# Patient Record
Sex: Female | Born: 1975 | Race: White | Hispanic: No | Marital: Married | Smoking: Former smoker
Health system: Southern US, Community
[De-identification: ages and names within clinical notes are randomized; demographics above are authoritative.]

## PROBLEM LIST (undated history)

## (undated) DIAGNOSIS — B019 Varicella without complication: Secondary | ICD-10-CM

## (undated) DIAGNOSIS — N63 Unspecified lump in unspecified breast: Secondary | ICD-10-CM

## (undated) DIAGNOSIS — N921 Excessive and frequent menstruation with irregular cycle: Secondary | ICD-10-CM

## (undated) DIAGNOSIS — A6000 Herpesviral infection of urogenital system, unspecified: Secondary | ICD-10-CM

## (undated) HISTORY — PX: BREAST EXCISIONAL BIOPSY: SUR124

## (undated) HISTORY — DX: Varicella without complication: B01.9

## (undated) HISTORY — DX: Unspecified lump in unspecified breast: N63.0

## (undated) HISTORY — DX: Herpesviral infection of urogenital system, unspecified: A60.00

## (undated) HISTORY — DX: Excessive and frequent menstruation with irregular cycle: N92.1

---
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
## 1998-06-27 HISTORY — PX: WISDOM TOOTH EXTRACTION: SHX21

## 1998-06-27 HISTORY — PX: MOUTH SURGERY: SHX715

## 1999-02-04 ENCOUNTER — Encounter: Payer: Self-pay | Admitting: Emergency Medicine

## 1999-02-04 ENCOUNTER — Emergency Department (HOSPITAL_COMMUNITY): Admission: EM | Admit: 1999-02-04 | Discharge: 1999-02-04 | Payer: Self-pay | Admitting: Emergency Medicine

## 1999-02-14 ENCOUNTER — Emergency Department (HOSPITAL_COMMUNITY): Admission: EM | Admit: 1999-02-14 | Discharge: 1999-02-14 | Payer: Self-pay | Admitting: Emergency Medicine

## 1999-10-12 ENCOUNTER — Other Ambulatory Visit: Admission: RE | Admit: 1999-10-12 | Discharge: 1999-10-12 | Payer: Self-pay | Admitting: Obstetrics and Gynecology

## 2000-11-22 ENCOUNTER — Other Ambulatory Visit: Admission: RE | Admit: 2000-11-22 | Discharge: 2000-11-22 | Payer: Self-pay | Admitting: Obstetrics and Gynecology

## 2006-06-27 HISTORY — PX: EYE SURGERY: SHX253

## 2012-06-27 DIAGNOSIS — N63 Unspecified lump in unspecified breast: Secondary | ICD-10-CM

## 2012-06-27 HISTORY — DX: Unspecified lump in unspecified breast: N63.0

## 2012-09-20 ENCOUNTER — Ambulatory Visit: Payer: Self-pay

## 2012-10-02 ENCOUNTER — Encounter: Payer: Self-pay | Admitting: General Surgery

## 2012-10-02 ENCOUNTER — Other Ambulatory Visit: Payer: Self-pay

## 2012-10-02 ENCOUNTER — Ambulatory Visit (INDEPENDENT_AMBULATORY_CARE_PROVIDER_SITE_OTHER): Payer: Self-pay | Admitting: General Surgery

## 2012-10-02 VITALS — BP 128/86 | HR 78 | Resp 12 | Ht 68.0 in | Wt 181.0 lb

## 2012-10-02 DIAGNOSIS — N63 Unspecified lump in unspecified breast: Secondary | ICD-10-CM

## 2012-10-02 NOTE — Progress Notes (Signed)
Patient ID: Emily Jensen, female   DOB: 02/27/1976, 37 y.o.   MRN: [id number]  Chief Complaint  Patient presents with  . Breast Mass    left breast mass 12 o'clock    HPI Emily Jensen is a 37 y.o. female here today from a follow up mammogram done 09/05/12. Left breast mass 12 o 'clock. Patient states she noticed it approximately 2 weeks ago. She states no pain, swelling, or discharge. She states no prior problems with the breasts. Her mother has a history of FCD.  HPI  Past Medical History  Diagnosis Date  . Breast mass 2014    left breast 12 o'clcok    Past Surgical History  Procedure Laterality Date  . Knee surgery  1992  . Eye surgery  2008    History reviewed. No pertinent family history.  Social History History  Substance Use Topics  . Smoking status: Former Games developer  . Smokeless tobacco: Former Neurosurgeon    Quit date: 06/27/1997  . Alcohol Use: Yes     Comment: occasionally    Allergies  Allergen Reactions  . Codeine Palpitations    Current Outpatient Prescriptions  Medication Sig Dispense Refill  . norgestimate-ethinyl estradiol (ORTHO-CYCLEN,SPRINTEC,PREVIFEM) 0.25-35 MG-MCG tablet Take 1 tablet by mouth daily.       No current facility-administered medications for this visit.    Review of Systems Review of Systems  Constitutional: Negative.   Respiratory: Negative.   Cardiovascular: Negative.     Blood pressure 128/86, pulse 78, resp. rate 12, height 5\' 8"  (1.727 m), weight 181 lb (82.101 kg), last menstrual period 09/21/2012.  Physical Exam Physical Exam  Constitutional: She appears well-developed and well-nourished.  Eyes: Conjunctivae are normal. No scleral icterus.  Neck: Trachea normal. No mass and no thyromegaly present.  Cardiovascular: Normal rate, regular rhythm, normal heart sounds and normal pulses.   No murmur heard. Pulmonary/Chest: Effort normal and breath sounds normal. Right breast exhibits tenderness. Right breast exhibits no  inverted nipple, no mass, no nipple discharge and no skin change. Left breast exhibits inverted nipple and mass. Left breast exhibits no nipple discharge, no skin change and no tenderness.    Lymphadenopathy:    She has no cervical adenopathy.    She has no axillary adenopathy.    Data Reviewed Mammogram revealed  A laege mass left at 12 o'cl, corresponding to palpable area. Also noted a questionable smaller nodule at 2-3 o'cl location. [place] was performed at [facility] the palpable mass was evaluated- >3cm lobulated hypoechoic mass, with some hazy borders.  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 Confirmatory core biopsy recommended. [place] over left breast 2-4 o'cl showed no findings and [place] over mild thickening in right breast also negative.    Plan    Core biopsy left breast mass. If confirms a fibroadenoma, will still recommend excision given the large size.        SANKAR,SEEPLAPUTHUR G 10/03/2012, 3:11 PM

## 2012-10-02 NOTE — Patient Instructions (Addendum)
Patient to return in 1 week for Nurse Check.       CARE AFTER BREAST BIOPSY  1. Leave the dressing on that your doctor applied after surgery. It is waterproof. You may bathe, shower and/or swim. The dressing will probably remain intact until your return office visit. If the dressing comes off, you will see small strips of tape against your skin on the incision. Do not remove these strips.  2. You may want to use a gauze,cloth or similar protection in your bra to prevent rubbing against your dressing and incision. This is not necessary, but you may feel more comfortable doing so.  3. It is recommended that you wear a bra day and night to give support to the breast. This will prevent the weight of the breast from pulling on the incision.  4. Your breast will feel hard and lumpy under the incision. Do not be alarmed. This is the underlying stitching of tissue. Softening of this tissue will occur in time.  5. Make sure you call the office and schedule an appointment in one week after your surgery. The office phone number is [phone]. The nurses at [facility] may have already done this for you.  6. You will notice about a week after your office visit that the strips of the tape on your incision will begin to loosen. These may then be removed.  7. Report to your doctor any of the following:  * Severe pain not relieved by your pain medication  *Redness of the incision  * Drainage from the incision  *Fever greater than 101 degrees

## 2012-10-03 ENCOUNTER — Encounter: Payer: Self-pay | Admitting: General Surgery

## 2012-10-04 ENCOUNTER — Telehealth: Payer: Self-pay | Admitting: *Deleted

## 2012-10-04 LAB — PATHOLOGY

## 2012-10-04 NOTE — Telephone Encounter (Signed)
Notified patient of most recent left breast biopsy results, fibroadenoma, no cancer.  Arrange for excision, pt agrees.

## 2012-10-05 NOTE — Telephone Encounter (Signed)
Patient scheduled for excision left breast mass on 10-11-12 at [facility]. She will call if she has further questions.

## 2012-10-08 ENCOUNTER — Other Ambulatory Visit: Payer: Self-pay | Admitting: General Surgery

## 2012-10-08 DIAGNOSIS — D242 Benign neoplasm of left breast: Secondary | ICD-10-CM

## 2012-10-11 ENCOUNTER — Ambulatory Visit: Payer: Self-pay | Admitting: General Surgery

## 2012-10-11 DIAGNOSIS — D249 Benign neoplasm of unspecified breast: Secondary | ICD-10-CM

## 2012-10-11 HISTORY — PX: BREAST BIOPSY: SHX20

## 2012-10-15 ENCOUNTER — Encounter: Payer: Self-pay | Admitting: *Deleted

## 2012-10-15 ENCOUNTER — Encounter: Payer: Self-pay | Admitting: General Surgery

## 2012-10-22 ENCOUNTER — Encounter: Payer: Self-pay | Admitting: General Surgery

## 2012-10-22 ENCOUNTER — Ambulatory Visit (INDEPENDENT_AMBULATORY_CARE_PROVIDER_SITE_OTHER): Payer: BC Managed Care – PPO | Admitting: General Surgery

## 2012-10-22 VITALS — BP 128/74 | HR 74 | Resp 12 | Ht 68.0 in | Wt 180.0 lb

## 2012-10-22 DIAGNOSIS — D242 Benign neoplasm of left breast: Secondary | ICD-10-CM

## 2012-10-22 DIAGNOSIS — D249 Benign neoplasm of unspecified breast: Secondary | ICD-10-CM | POA: Insufficient documentation

## 2012-10-22 NOTE — Patient Instructions (Addendum)
Return in one year, or sooner if needed.

## 2012-10-22 NOTE — Progress Notes (Signed)
   Subjective:     Patient ID: Emily Jensen, female   DOB: 14-Dec-1975, 37 y.o.   MRN: [id number]  HPI This is a 37 year old female here for her post op left breast excision done on 10/11/12. Patient states a little sore.   Review of Systems  Constitutional: Negative.   Respiratory: Negative.   Cardiovascular: Negative.        Objective:   Physical Exam  Pulmonary/Chest:  Left breast excision site healing well       Assessment:    fibroadenoma  Almost 4 cm size    Plan:   1 year follow up

## 2013-07-23 ENCOUNTER — Ambulatory Visit (INDEPENDENT_AMBULATORY_CARE_PROVIDER_SITE_OTHER): Payer: BC Managed Care – PPO | Admitting: Adult Health

## 2013-07-23 ENCOUNTER — Encounter: Payer: Self-pay | Admitting: Adult Health

## 2013-07-23 VITALS — BP 108/66 | HR 66 | Temp 98.0°F | Resp 12 | Ht 67.5 in | Wt 181.5 lb

## 2013-07-23 DIAGNOSIS — R4586 Emotional lability: Secondary | ICD-10-CM | POA: Insufficient documentation

## 2013-07-23 DIAGNOSIS — F39 Unspecified mood [affective] disorder: Secondary | ICD-10-CM

## 2013-07-23 DIAGNOSIS — R42 Dizziness and giddiness: Secondary | ICD-10-CM

## 2013-07-23 DIAGNOSIS — Z23 Encounter for immunization: Secondary | ICD-10-CM

## 2013-07-23 NOTE — Assessment & Plan Note (Addendum)
Suspect these are pre menstrual symptoms. She does not want to take [facility] pills or other medication if she can control these naturally. Recommend keeping records of when they occur within her cycle and how long. Specific symptoms. What helps, etc. Recommend exercise as this will improve her overall well being. Stress reducing techniques. May have some benefit with B6.

## 2013-07-23 NOTE — Patient Instructions (Addendum)
   Thank you for choosing [place] [facility] for your health care needs.  Please return for your physical in April.  Please feel free to call with any questions or concerns.  Remember to keep a records of your mood swings as well as when the dizziness is occuring.

## 2013-07-23 NOTE — Progress Notes (Signed)
Pre visit review using our clinic review tool, if applicable. No additional management support is needed unless otherwise documented below in the visit note. 

## 2013-07-23 NOTE — Progress Notes (Signed)
Subjective:    Patient ID: Emily Jensen, female    DOB: 1975/11/05, 38 y.o.   MRN: [id number]  HPI  Emily Jensen is a 38 y/o female who presents to clinic to establish care. Followed by provider in [facility], [place] prior to moving here approximately 5 years ago. She has been followed by her OB/GYN at [facility] and desires to have everything transferred to our office. She is not planning on having any more children and would like her healthcare centralized at one location. She has had labs done within the year. Will request records.   1) Just stopped BC 3 months ago. She feels she is experiencing mood swings. She will have episodes of crying. She is able to control her symptoms but she feels this last approximately 1 day. She also has periods where she is angry and gets angry at her family "for no reason". She has been on [facility] since age 78 and has really never experienced these symptoms. She has not really paid attention to when these symptoms are occuring within her cycle.  2) Dizzy spells - ongoing for approximately 2 years. Last approximately 2-3 seconds. Occur ~ once every other month. Feels slightly off balance. Occurs while she is standing or sitting. Has not really noticed or paid attention to what is occuring when these happen - changing positions, head movement, fasting or when she last ate, etc. They only last seconds. No other symptoms associated. Denies problems with her blood sugar or blood pressure. B/P is on the lower end of normal. Denies any abnormal labs which were done ~ 6 months ago. Request records.    Past Medical History  Diagnosis Date  . Breast mass 2014    left breast 12 o'clcok  . Chicken pox   . Genital herpes      Past Surgical History  Procedure Laterality Date  . Knee surgery Left 1992    Torn meniscus & ACL  . Eye surgery  2008    Lasix  . Breast surgery Left 2014    Biopsy - benign     Family History  Problem Relation Age of Onset  . Hypertension  Mother   . Heart disease Father     CAD - Quadruple bypass  . Hypertension Father   . Diabetes Paternal Grandfather   . Cancer Maternal Grandfather 22    Colon Cancer     History   Social History  . Marital Status: Married    Spouse Name: Disha Cottam    Number of Children: 2  . Years of Education: 18   Occupational History  . School Counselor     [employer]   Social History Main Topics  . Smoking status: Former Smoker    Types: Cigarettes  . Smokeless tobacco: Never Used  . Alcohol Use: Yes     Comment: occasionally - maybe 1 drink/week  . Drug Use: No  . Sexual Activity: Not on file   Other Topics Concern  . Not on file   Social History Narrative   [name] Her father moved approximately every 5 years for job opportunities. She attended [employer] for her Bachelor's in Health and PE. She then attended [employer] on line for her Master's in [employer]. She works as a Animal nutritionist for [employer]. She currently lives at home with her husband, Aaron Edelman, and two sons (Aiden, Roxy Manns). They have a family dog named R.J. She enjoys the outdoors.  Caffeine - 1 daily   No smoking   Not currently exercising. Plans on starting   Diet is fairly healthy.     Health Maintenance:  Tdap - Today  Flu shot - Not interested  PAP - 2014  Mammography - 09/2012 also with ultrasound. Biopsy negative  Labs - 08/2012 - Request from [facility]  Depression Screen - No symptoms of depression. No feelings of sadness or loss of interest in activities.  Tobacco Use - None  Dental Exams - Twice yearly  Vision Exam - 2015 - Night vision glasses  Exercise - Rarely  Diet - Fairly healthy - once every other week may have fast food but this is rare. Fruits, vegetables, olive oil, nuts, lean meats.    Review of Systems  Constitutional: Negative.   HENT: Negative.   Eyes: Negative.   Respiratory: Negative.     Cardiovascular: Negative.   Gastrointestinal: Negative.   Endocrine: Negative.   Genitourinary: Negative.   Musculoskeletal: Negative.   Skin: Negative.   Allergic/Immunologic: Negative.   Neurological: Positive for dizziness. Negative for tremors, syncope, weakness, numbness and headaches.       Transient episodes lasting 2-3 seconds occuring ~ every other month.  Hematological: Negative.   Psychiatric/Behavioral:       Mood swings since stopping BC pills 3 months ago       Objective:   Physical Exam  Constitutional: She is oriented to person, place, and time. She appears well-developed and well-nourished. No distress.  HENT:  Head: Normocephalic and atraumatic.  Right Ear: External ear normal.  Left Ear: External ear normal.  Nose: Nose normal.  Mouth/Throat: Oropharynx is clear and moist.  Eyes: Conjunctivae and EOM are normal. Pupils are equal, round, and reactive to light.  Neck: Normal range of motion. Neck supple. No tracheal deviation present. No thyromegaly present.  Cardiovascular: Normal rate, regular rhythm, normal heart sounds and intact distal pulses.  Exam reveals no gallop and no friction rub.   No murmur heard. Pulmonary/Chest: Effort normal and breath sounds normal. No respiratory distress. She has no wheezes. She has no rales.  Abdominal: Soft. Bowel sounds are normal. She exhibits no distension and no mass. There is no tenderness. There is no rebound and no guarding.  Musculoskeletal: Normal range of motion. She exhibits no edema and no tenderness.  Lymphadenopathy:    She has no cervical adenopathy.  Neurological: She is alert and oriented to person, place, and time. She has normal reflexes. No cranial nerve deficit. Coordination normal.  Skin: Skin is warm and dry.  Psychiatric: She has a normal mood and affect. Her behavior is normal. Judgment and thought content normal.   BP 108/66  Pulse 66  Temp(Src) 98 F (36.7 C) (Oral)  Resp 12  Ht 5' 7.5"  (1.715 m)  Wt 181 lb 8 oz (82.328 kg)  BMI 27.99 kg/m2  SpO2 97%     Assessment & Plan:

## 2013-07-24 ENCOUNTER — Telehealth: Payer: Self-pay | Admitting: Adult Health

## 2013-07-24 DIAGNOSIS — R42 Dizziness and giddiness: Secondary | ICD-10-CM | POA: Insufficient documentation

## 2013-07-24 NOTE — Assessment & Plan Note (Signed)
Recommend keeping detailed record of when they occur and what is going on at the moment such as whether she is fasting, how low previous meal, rapid movement or changes in position, etc. Patient agreed. ? BPPV vs low b/p.

## 2013-07-24 NOTE — Telephone Encounter (Signed)
  Researched natural remedies for Pre menstrual syndrome -  Recommendations are for exercise, relaxation techniques  There is some reports that Vit E and a low dose of B6 may be helpful but the studies have not confirmed this. Magnesium was also listed.  Medication would be the route to go if symptoms do not improve with the above.

## 2013-07-25 ENCOUNTER — Encounter: Payer: Self-pay | Admitting: *Deleted

## 2013-07-25 NOTE — Telephone Encounter (Signed)
Mailed letter to pt with information

## 2013-10-28 ENCOUNTER — Encounter: Payer: Self-pay | Admitting: General Surgery

## 2013-10-28 ENCOUNTER — Ambulatory Visit (INDEPENDENT_AMBULATORY_CARE_PROVIDER_SITE_OTHER): Payer: BC Managed Care – PPO | Admitting: General Surgery

## 2013-10-28 ENCOUNTER — Other Ambulatory Visit: Payer: BC Managed Care – PPO

## 2013-10-28 VITALS — BP 116/70 | HR 65 | Resp 12 | Ht 68.0 in | Wt 181.0 lb

## 2013-10-28 DIAGNOSIS — N6009 Solitary cyst of unspecified breast: Secondary | ICD-10-CM | POA: Insufficient documentation

## 2013-10-28 DIAGNOSIS — N63 Unspecified lump in unspecified breast: Secondary | ICD-10-CM

## 2013-10-28 NOTE — Patient Instructions (Signed)
Continue self breast exams. Call office for any new breast issues or concerns. 

## 2013-10-28 NOTE — Progress Notes (Deleted)
Patient ID: Emily Jensen, female   DOB: 1976-04-28, 38 y.o.   MRN: [id number]  Chief Complaint  Patient presents with  . Follow-up    breast     HPI Emily Jensen is a 38 y.o. female here today following up from left breast mass excision done on 10/11/12- 4 cm fibroadenoma.Patient states she is doing well.  HPI  Past Medical History  Diagnosis Date  . Breast mass 2014    left breast 12 o'clcok  . Chicken pox   . Genital herpes     Past Surgical History  Procedure Laterality Date  . Knee surgery Left 1992    Torn meniscus & ACL  . Eye surgery  2008    Lasix  . Breast surgery Left 2014    Biopsy - benign    Family History  Problem Relation Age of Onset  . Hypertension Mother   . Heart disease Father     CAD - Quadruple bypass  . Hypertension Father   . Diabetes Paternal Grandfather   . Cancer Maternal Grandfather 30    Colon Cancer    Social History History  Substance Use Topics  . Smoking status: Former Smoker    Types: Cigarettes  . Smokeless tobacco: Never Used  . Alcohol Use: Yes     Comment: occasionally - maybe 1 drink/week    Allergies  Allergen Reactions  . Codeine Palpitations    No current outpatient prescriptions on file.   No current facility-administered medications for this visit.    Review of Systems Review of Systems  Constitutional: Negative.   Respiratory: Negative.   Cardiovascular: Negative.     Blood pressure 116/70, pulse 65, resp. rate 12, height 5\' 8"  (1.727 m), weight 181 lb (82.101 kg), last menstrual period 10/28/2013.  Physical Exam Physical Exam  Constitutional: She appears well-developed and well-nourished.  Neck: Neck supple. No thyromegaly present.  Pulmonary/Chest: Right breast exhibits no inverted nipple, no mass, no nipple discharge, no skin change and no tenderness. Left breast exhibits no inverted nipple, no mass, no nipple discharge, no skin change and no tenderness. Breasts are symmetrical.     Lymphadenopathy:    She has no cervical adenopathy.    She has no axillary adenopathy.    Data Reviewed None  Assessment    Ultrasound preformed over prior mass excision site on left breast. Only a 57mm cyst noted at 11 ocl  3cm from nipple.    Plan    Patient to return as needed. Advised yearly exam by her PCP and her own monthly exam       Gaspar Cola 10/28/2013, 8:51 AM

## 2013-11-13 NOTE — Progress Notes (Signed)
Patient ID: Emily Jensen, female   DOB: 1976-02-20, 38 y.o.   MRN: [id number]  Chief Complaint  Patient presents with  . Follow-up    breast     HPI Emily Jensen is a 38 y.o. female.    HPI  Past Medical History  Diagnosis Date  . Breast mass 2014    left breast 12 o'clcok  . Chicken pox   . Genital herpes     Past Surgical History  Procedure Laterality Date  . Knee surgery Left 1992    Torn meniscus & ACL  . Eye surgery  2008    Lasix  . Breast surgery Left 2014    Biopsy - benign    Family History  Problem Relation Age of Onset  . Hypertension Mother   . Heart disease Father     CAD - Quadruple bypass  . Hypertension Father   . Diabetes Paternal Grandfather   . Cancer Maternal Grandfather 41    Colon Cancer    Social History History  Substance Use Topics  . Smoking status: Former Smoker    Types: Cigarettes  . Smokeless tobacco: Never Used  . Alcohol Use: Yes     Comment: occasionally - maybe 1 drink/week    Allergies  Allergen Reactions  . Codeine Palpitations    No current outpatient prescriptions on file.   No current facility-administered medications for this visit.    Review of Systems Review of Systems  Blood pressure 116/70, pulse 65, resp. rate 12, height 5\' 8"  (1.727 m), weight 181 lb (82.101 kg), last menstrual period 10/28/2013.  Physical Exam Physical Exam  Data Reviewed    Assessment   Patient ID: Emily Jensen, female   DOB: 04/30/1976, 38 y.o.   MRN: [id number]  Chief Complaint  Patient presents with  . Follow-up    breast     HPI Emily Jensen is a 38 y.o. female here today following up from left breast mass excision done on 10/11/12- 4 cm fibroadenoma.Patient states she is doing well.  HPI  Past Medical History  Diagnosis Date  . Breast mass 2014    left breast 12 o'clcok  . Chicken pox   . Genital herpes     Past Surgical History  Procedure Laterality Date  . Knee surgery Left 1992    Torn meniscus &  ACL  . Eye surgery  2008    Lasix  . Breast surgery Left 2014    Biopsy - benign    Family History  Problem Relation Age of Onset  . Hypertension Mother   . Heart disease Father     CAD - Quadruple bypass  . Hypertension Father   . Diabetes Paternal Grandfather   . Cancer Maternal Grandfather 36    Colon Cancer    Social History History  Substance Use Topics  . Smoking status: Former Smoker    Types: Cigarettes  . Smokeless tobacco: Never Used  . Alcohol Use: Yes     Comment: occasionally - maybe 1 drink/week    Allergies  Allergen Reactions  . Codeine Palpitations    No current outpatient prescriptions on file.   No current facility-administered medications for this visit.    Review of Systems Review of Systems  Constitutional: Negative.   Respiratory: Negative.   Cardiovascular: Negative.     Blood pressure 116/70, pulse 65, resp. rate 12, height 5\' 8"  (1.727 m), weight 181 lb (82.101 kg), last menstrual period 10/28/2013.  Physical Exam  Physical Exam  Constitutional: She appears well-developed and well-nourished.  Neck: Neck supple. No thyromegaly present.  Pulmonary/Chest: Right breast exhibits no inverted nipple, no mass, no nipple discharge, no skin change and no tenderness. Left breast exhibits no inverted nipple, no mass, no nipple discharge, no skin change and no tenderness. Breasts are symmetrical.    Lymphadenopathy:    She has no cervical adenopathy.    She has no axillary adenopathy.    Data Reviewed None  Assessment    Ultrasound preformed over prior mass excision site on left breast. Only a 40mm cyst noted at 11 ocl  3cm from nipple.    Plan    Patient to return as needed. Advised yearly exam by her PCP and her own monthly exam                            Kenneth Lax Robinette Haines 11/13/2013, 5:54 PM

## 2013-11-15 ENCOUNTER — Ambulatory Visit (INDEPENDENT_AMBULATORY_CARE_PROVIDER_SITE_OTHER): Payer: BC Managed Care – PPO | Admitting: Adult Health

## 2013-11-15 ENCOUNTER — Encounter: Payer: Self-pay | Admitting: Adult Health

## 2013-11-15 VITALS — BP 116/64 | HR 94 | Temp 98.0°F | Resp 14 | Wt 183.0 lb

## 2013-11-15 DIAGNOSIS — J329 Chronic sinusitis, unspecified: Secondary | ICD-10-CM

## 2013-11-15 MED ORDER — AZITHROMYCIN 250 MG PO TABS: ORAL_TABLET | ORAL | Status: DC

## 2013-11-15 NOTE — Progress Notes (Signed)
Patient ID: Emily Jensen, female   DOB: [birth year], 38 y.o.   MRN: [id number]   Subjective:    Patient ID: Emily Jensen, female    DOB: 08/30/1975, 38 y.o.   MRN: [id number]  HPI  Pt is a 38 y/o female who presents to clinic with 7 day hx of cough, sinus pressure, rhinorrhea. Initially she had a sore throat but this is improved.   Past Medical History  Diagnosis Date  . Breast mass 2014    left breast 12 o'clcok  . Chicken pox   . Genital herpes     Review of Systems  Constitutional: Negative for fever and chills.  HENT: Positive for congestion, postnasal drip, rhinorrhea, sinus pressure and sneezing. Negative for sore throat.   Respiratory: Positive for cough.   Musculoskeletal:       Swollen and painful right hand s/p injury       Objective:  BP 116/64  Pulse 94  Temp(Src) 98 F (36.7 C) (Oral)  Resp 14  Wt 183 lb (83.008 kg)  SpO2 98%  LMP 10/28/2013   Physical Exam  Constitutional: She is oriented to person, place, and time. She appears well-developed and well-nourished. No distress.  HENT:  Head: Normocephalic and atraumatic.  Right Ear: External ear normal.  Left Ear: External ear normal.  Mouth/Throat: No oropharyngeal exudate.  Eyes: Conjunctivae and EOM are normal.  Cardiovascular: Normal rate, regular rhythm and normal heart sounds.  Exam reveals no gallop.   No murmur heard. Pulmonary/Chest: Effort normal and breath sounds normal. No respiratory distress. She has no wheezes. She has no rales.  Lymphadenopathy:    She has cervical adenopathy.  Neurological: She is alert and oriented to person, place, and time.  Psychiatric: She has a normal mood and affect. Her behavior is normal. Judgment and thought content normal.      Assessment & Plan:   1. Sinusitis Saline spray to irrigate sinuses. Delsym for cough. Flonase 2 sprays into each nostril daily. Afrin nasal spray if nasal congestion occurs. Provided patient with prescription for azithromycin and  instructed not to take unless she develops a fever, painful sinuses or symptoms do not improve within 4-5 days.

## 2013-11-15 NOTE — Progress Notes (Signed)
Pre visit review using our clinic review tool, if applicable. No additional management support is needed unless otherwise documented below in the visit note. 

## 2013-11-15 NOTE — Patient Instructions (Signed)
  Irrigate sinuses with saline spray  Flonase nasal spray 2 sprays in each nostril daily  Afrin nasal spray twice daily for 3 days if nasal congestion occurs. More than 3 days will cause rebound stuffiness.  Delsym cough syrup for cough  Azithromycin if you develop a fever, chills, sinus pain. Azithromycin is also good for traveler's diarrhea

## 2014-04-28 ENCOUNTER — Encounter: Payer: Self-pay | Admitting: Adult Health

## 2014-05-27 ENCOUNTER — Ambulatory Visit (INDEPENDENT_AMBULATORY_CARE_PROVIDER_SITE_OTHER): Payer: BC Managed Care – PPO | Admitting: Nurse Practitioner

## 2014-05-27 ENCOUNTER — Encounter (INDEPENDENT_AMBULATORY_CARE_PROVIDER_SITE_OTHER): Payer: Self-pay

## 2014-05-27 ENCOUNTER — Telehealth: Payer: Self-pay | Admitting: Nurse Practitioner

## 2014-05-27 ENCOUNTER — Encounter: Payer: Self-pay | Admitting: Nurse Practitioner

## 2014-05-27 VITALS — BP 102/66 | HR 72 | Resp 14 | Ht 68.0 in | Wt 187.2 lb

## 2014-05-27 DIAGNOSIS — M940 Chondrocostal junction syndrome [Tietze]: Secondary | ICD-10-CM

## 2014-05-27 MED ORDER — TRAMADOL HCL 50 MG PO TABS: ORAL_TABLET | ORAL | Status: DC

## 2014-05-27 NOTE — Progress Notes (Signed)
Pre visit review using our clinic review tool, if applicable. No additional management support is needed unless otherwise documented below in the visit note. 

## 2014-05-27 NOTE — Telephone Encounter (Signed)
I made the patient an appt at 2:30pm today.

## 2014-05-27 NOTE — Telephone Encounter (Signed)
Can she come at 2:30 pm or 3:30 pm? We can sneak her on my schedule.

## 2014-05-27 NOTE — Telephone Encounter (Signed)
Meagan from call a nurse says the patient is having chest pains when she coughs, sneezes or takes a deep breath, and it's been going on for a week now.  Meagan would like to know if you can fit this patient into your schedule in the next 4 hours.

## 2014-05-27 NOTE — Assessment & Plan Note (Signed)
New onset costochondritis. Rx for Tramadol to try. Counseled on possible same side effects as codeine, stop med, and to call our office if occurs to change med. If pain does not improve or worsens will obtain Chest X-ray. Pt was given handout with info on dx and is to call us immediately if any red flags occur or seek emergency care when closed.

## 2014-05-27 NOTE — Progress Notes (Signed)
Subjective:    Patient ID: Emily Jensen, female    DOB: [birth year], 38 y.o.   MRN: [id number]  HPI  Ms. Scoggin is a 38 yo female here with a CC of chest pain that began last Wednesday (11/25).  1) Localized chest pain- Location- just Lateral to sternum on right, feels "like a needle poking". Happens especially when sneezing, coughing, moving, and deep breathing. Pain also worsens when sitting up and laying down at a certain point. This also woke her up during sleeping occasionally when she is moving positions. She has tried Advil daily x 2-3 doses reports it is not helpful. Also tired Magnesium, which did not help.  She reports a cold/cough at the end of October beginning of November and she used mucinex, which was helpful.  Severity of pain- Best 2-3/10    Worst10/10 for duration of 2 seconds when moving/sneezing ect... Denies trauma  Past breast cyst surgically removed on left breast in 2014  Worked out this morning on the elliptical without difficulty LMP: Approx 05/16/14    Review of Systems  Constitutional: Negative for fever and unexpected weight change.  Respiratory: Negative for chest tightness, shortness of breath and wheezing.   Cardiovascular: Negative for palpitations and leg swelling.  Musculoskeletal: Negative for myalgias.  Skin: Negative for rash.  Neurological: Negative for dizziness, syncope, weakness and light-headedness.   Past Medical History  Diagnosis Date  . Breast mass 2014    left breast 12 o'clcok  . Chicken pox   . Genital herpes     History   Social History  . Marital Status: Married    Spouse Name: Shira Bobst    Number of Children: 2  . Years of Education: 18   Occupational History  . School Counselor     [employer]   Social History Main Topics  . Smoking status: Former Smoker    Types: Cigarettes  . Smokeless tobacco: Never Used  . Alcohol Use: Yes     Comment: occasionally - maybe 1 drink/week  . Drug Use: No  . Sexual  Activity: Not on file   Other Topics Concern  . Not on file   Social History Narrative   Ishani [place] up "[place] over". Her father moved approximately every 5 years for job opportunities. She attended [employer] for her Bachelor's in Health and PE. She then attended [employer] on line for her Master's in [employer]. She works as a Animal nutritionist for [employer]. She currently lives at home with her husband, Aaron Edelman, and two sons (Aiden, Roxy Manns). They have a family dog named R.J. She enjoys the outdoors.      Caffeine - 1 daily   No smoking   Not currently exercising. Plans on starting   Diet is fairly healthy.    Past Surgical History  Procedure Laterality Date  . Knee surgery Left 1992    Torn meniscus & ACL  . Eye surgery  2008    Lasix  . Breast surgery Left 2014    Biopsy - benign    Family History  Problem Relation Age of Onset  . Hypertension Mother   . Heart disease Father     CAD - Quadruple bypass  . Hypertension Father   . Diabetes Paternal Grandfather   . Cancer Maternal Grandfather 87    Colon Cancer    Allergies  Allergen Reactions  . Codeine Palpitations    No current outpatient prescriptions on file prior to visit.  No current facility-administered medications on file prior to visit.      Objective:   Physical Exam  Constitutional: She is oriented to person, place, and time. She appears well-developed and well-nourished. No distress.  HENT:  Head: Normocephalic and atraumatic.  Cardiovascular: Normal rate, regular rhythm and normal heart sounds.   Pulmonary/Chest: Effort normal and breath sounds normal.  Musculoskeletal: Normal range of motion. She exhibits no edema or tenderness.  Neurological: She is alert and oriented to person, place, and time. She displays normal reflexes. She exhibits normal muscle tone. Coordination normal.  Skin: Skin is warm and dry. She is not diaphoretic.  Psychiatric: She has a normal mood and  affect. Her behavior is normal. Judgment and thought content normal.   BP 102/66 mmHg  Pulse 72  Resp 14  Ht 5\' 8"  (1.727 m)  Wt 187 lb 4 oz (84.936 kg)  BMI 28.48 kg/m2  SpO2 95% *97% O2 on retake at 2:48 pm     Assessment & Plan:

## 2014-05-27 NOTE — Patient Instructions (Addendum)
If you are still having this pain at the beginning of next week call us and we will put in a chest x-ray There is a slight chance of a same reaction as with Codeine with Tramadol. If you are having palpitations you can stop this medication  And call us to change the medication.  Call us if you have changes in symptoms, fever, worsening pain, or it is failing to improve.    Costochondritis Costochondritis, sometimes called Tietze syndrome, is a swelling and irritation (inflammation) of the tissue (cartilage) that connects your ribs with your breastbone (sternum). It causes pain in the chest and rib area. Costochondritis usually goes away on its own over time. It can take up to 6 weeks or longer to get better, especially if you are unable to limit your activities. CAUSES  Some cases of costochondritis have no known cause. Possible causes include:  Injury (trauma).  Exercise or activity such as lifting.  Severe coughing. SIGNS AND SYMPTOMS  Pain and tenderness in the chest and rib area.  Pain that gets worse when coughing or taking deep breaths.  Pain that gets worse with specific movements. DIAGNOSIS  Your health care provider will do a physical exam and ask about your symptoms. Chest X-rays or other tests may be done to rule out other problems. TREATMENT  Costochondritis usually goes away on its own over time. Your health care provider may prescribe medicine to help relieve pain. HOME CARE INSTRUCTIONS   Avoid exhausting physical activity. Try not to strain your ribs during normal activity. This would include any activities using chest, abdominal, and side muscles, especially if heavy weights are used.  Apply ice to the affected area for the first 2 days after the pain begins.  Put ice in a plastic bag.  Place a towel between your skin and the bag.  Leave the ice on for 20 minutes, 2-3 times a day.  Only take over-the-counter or prescription medicines as directed by your health  care provider. SEEK MEDICAL CARE IF:  You have redness or swelling at the rib joints. These are signs of infection.  Your pain does not go away despite rest or medicine. SEEK IMMEDIATE MEDICAL CARE IF:   Your pain increases or you are very uncomfortable.  You have shortness of breath or difficulty breathing.  You cough up blood.  You have worse chest pains, sweating, or vomiting.  You have a fever or persistent symptoms for more than 2-3 days.  You have a fever and your symptoms suddenly get worse. MAKE SURE YOU:   Understand these instructions.  Will watch your condition.  Will get help right away if you are not doing well or get worse. Document Released: 03/23/2005 Document Revised: 04/03/2013 Document Reviewed: 01/15/2013 [facility] Patient Information 2015 [place], [place]. This information is not intended to replace advice given to you by your health care provider. Make sure you discuss any questions you have with your health care provider.

## 2014-09-23 ENCOUNTER — Ambulatory Visit (INDEPENDENT_AMBULATORY_CARE_PROVIDER_SITE_OTHER): Payer: BC Managed Care – PPO | Admitting: Nurse Practitioner

## 2014-09-23 ENCOUNTER — Encounter: Payer: Self-pay | Admitting: Nurse Practitioner

## 2014-09-23 ENCOUNTER — Encounter (INDEPENDENT_AMBULATORY_CARE_PROVIDER_SITE_OTHER): Payer: Self-pay

## 2014-09-23 VITALS — BP 110/70 | HR 74 | Temp 98.1°F | Resp 12 | Ht 68.0 in | Wt 187.8 lb

## 2014-09-23 DIAGNOSIS — N6002 Solitary cyst of left breast: Secondary | ICD-10-CM | POA: Diagnosis not present

## 2014-09-23 DIAGNOSIS — N926 Irregular menstruation, unspecified: Secondary | ICD-10-CM | POA: Diagnosis not present

## 2014-09-23 MED ORDER — NORGESTIM-ETH ESTRAD TRIPHASIC 0.18/0.215/0.25 MG-25 MCG PO TABS: 1.0000 | ORAL_TABLET | Freq: Every day | ORAL | Status: DC

## 2014-09-23 NOTE — Progress Notes (Signed)
Pre visit review using our clinic review tool, if applicable. No additional management support is needed unless otherwise documented below in the visit note. 

## 2014-09-23 NOTE — Progress Notes (Signed)
   Subjective:    Patient ID: Emily Jensen, female    DOB: 1975/09/06, 39 y.o.   MRN: [id number]  HPI  Emily Jensen is a 39 yo female with a CC of menstrual problems.   1) Vasectomy 1 year ago  Irregular periods being off of OCP x 1 year    Bleeding after sex 1-2 days later and lasts a day or two  Irregular cycles  2) April due for annual [place] of left breast because of watching a cyst.                Review of Systems  Constitutional: Negative for fever, chills, diaphoresis and fatigue.  Respiratory: Negative for chest tightness, shortness of breath and wheezing.   Cardiovascular: Negative for chest pain, palpitations and leg swelling.  Gastrointestinal: Negative for nausea, vomiting and diarrhea.  Genitourinary: Positive for vaginal bleeding and menstrual problem.  Skin: Negative for rash.  Neurological: Negative for dizziness, weakness, numbness and headaches.  Psychiatric/Behavioral: The patient is not nervous/anxious.        Objective:   Physical Exam  Constitutional: She is oriented to person, place, and time. She appears well-developed and well-nourished. No distress.  BP 110/70 mmHg  Pulse 74  Temp(Src) 98.1 F (36.7 C) (Oral)  Resp 12  Ht 5\' 8"  (1.727 m)  Wt 187 lb 12.8 oz (85.186 kg)  BMI 28.56 kg/m2  SpO2 98%  LMP    HENT:  Head: Normocephalic and atraumatic.  Right Ear: External ear normal.  Left Ear: External ear normal.  Cardiovascular: Normal rate, regular rhythm, normal heart sounds and intact distal pulses.  Exam reveals no gallop and no friction rub.   No murmur heard. Pulmonary/Chest: Effort normal and breath sounds normal. No respiratory distress. She has no wheezes. She has no rales. She exhibits no tenderness.  Genitourinary:  Deferred  Neurological: She is alert and oriented to person, place, and time. No cranial nerve deficit. She exhibits normal muscle tone. Coordination normal.  Skin: Skin is warm and dry. No rash noted. She is not diaphoretic.   Psychiatric: She has a normal mood and affect. Her behavior is normal. Judgment and thought content normal.      Assessment & Plan:

## 2014-09-23 NOTE — Patient Instructions (Signed)
Our referral coordinator will be in contact with you.   Call if in three months your period is not regulated on the pill.

## 2014-09-27 DIAGNOSIS — N921 Excessive and frequent menstruation with irregular cycle: Secondary | ICD-10-CM | POA: Insufficient documentation

## 2014-09-27 NOTE — Assessment & Plan Note (Signed)
US breast left inc axilla. Will check on cyst

## 2014-09-27 NOTE — Assessment & Plan Note (Signed)
Menstrual cycle irregularities. Discussed about the option of going back on the OCP and she was okay with it for keeping periods regular. She did not want to see GYN yet. Will try and after 3 months if still not regulated will refer to GYN.

## 2014-10-10 ENCOUNTER — Other Ambulatory Visit: Payer: Self-pay | Admitting: Internal Medicine

## 2014-10-10 DIAGNOSIS — N6320 Unspecified lump in the left breast, unspecified quadrant: Secondary | ICD-10-CM

## 2014-10-17 NOTE — Op Note (Signed)
PATIENT NAME:  Emily Jensen, Emily Jensen MR#:  [id number] DATE OF BIRTH:  29-Apr-1976  DATE OF PROCEDURE:  10/11/2012  PREOPERATIVE DIAGNOSIS: Large fibroadenoma, left breast.   POSTOPERATIVE: DIAGNOSIS: Large fibroadenoma, left breast.   OPERATION: Excision, left breast mass.   SURGEON: Mckinley Jewel, M.D.   ANESTHESIA: Monitored care, with local anesthetic of 0.5% Marcaine mixed with  1% Xylocaine.   COMPLICATIONS: None.   ESTIMATED BLOOD LOSS: Approximately 20 mL.   DRAINS: None.   DESCRIPTION OF PROCEDURE: The patient was put in place in the supine position on the operating table. With adequate sedation and monitoring, the left breast was prepped and draped out as a sterile field. After a time-out procedure ultrasound unit was brought up to visualize the extent of the mass located between 11 to 12 o'clock positions. After this was outlined a curvilinear transverse incision overlying this mass was planned, and a local anesthetic block was obtained in this area. A skin incision was made and deepened through the subcutaneous tissue and into the glandular area and from there, with palpation and along the edges of the mass with a small rim of normal-appearing breast tissue the mass was excised out in full. After it was removed, cautery was used to control bleeding from the raw surface. The mass measured approximately 3 cm in diameter and was sent to pathology.   After ensuring hemostasis, the deeper tissues were closed in two layers with 2-0 Vicryl interrupted stitches. The skin was closed with subcuticular 4-0 Vicryl, covered with Dermabond. Procedure was well-tolerated. She was subsequently returned to the recovery room in stable condition.    ____________________________ S.Robinette Haines, MD sgs:dm D: 10/11/2012 09:34:24 ET T: 10/11/2012 09:57:39 ET JOB#: [id number]  cc: Synthia Innocent. Jamal Collin, MD, <Dictator> [facility] Robinette Haines MD ELECTRONICALLY SIGNED 10/11/2012 20:55

## 2014-11-14 ENCOUNTER — Encounter: Payer: Self-pay | Admitting: Nurse Practitioner

## 2014-11-14 ENCOUNTER — Ambulatory Visit (INDEPENDENT_AMBULATORY_CARE_PROVIDER_SITE_OTHER): Payer: BC Managed Care – PPO | Admitting: Nurse Practitioner

## 2014-11-14 VITALS — BP 110/74 | HR 91 | Temp 98.2°F | Resp 12 | Ht 68.0 in | Wt 185.8 lb

## 2014-11-14 DIAGNOSIS — Z0000 Encounter for general adult medical examination without abnormal findings: Secondary | ICD-10-CM

## 2014-11-14 DIAGNOSIS — R5383 Other fatigue: Secondary | ICD-10-CM

## 2014-11-14 DIAGNOSIS — Z1329 Encounter for screening for other suspected endocrine disorder: Secondary | ICD-10-CM

## 2014-11-14 DIAGNOSIS — Z130 Encounter for screening for diseases of the blood and blood-forming organs and certain disorders involving the immune mechanism: Secondary | ICD-10-CM

## 2014-11-14 DIAGNOSIS — Z13220 Encounter for screening for lipoid disorders: Secondary | ICD-10-CM | POA: Diagnosis not present

## 2014-11-14 NOTE — Addendum Note (Signed)
Addended by: Rubbie Battiest on: 11/14/2014 09:07 AM   Modules accepted: Miquel Dunn

## 2014-11-14 NOTE — Progress Notes (Signed)
Subjective:    Patient ID: Emily Jensen, female    DOB: 1976-06-01, 39 y.o.   MRN: [id number]  HPI  Emily Jensen is a 39 yo female here for her annual physical.   1) Health Maintenance-   Diet- Working on it  Exercise- 3 x a week, 30 min most times but 2 hrs on Sat  Immunizations- UTD  Mammogram- May 2016 at [facility]   Pap- Due 2017   Eye Exam- 11/2013   Dental Exam- UTD  2) Chronic Problems-   Irregular menstrual cycle- Still having a few 2 week periods. Just started the OCP recently, understanding that it will take some time to regulate.   Acute-Fatigue, waking up often in the middle of the night, x 1 month, worse in last 2 weeks, 1 pm feels very fatigued. No recent labs drawn, denies snoring.    Review of Systems  Constitutional: Positive for fatigue. Negative for fever, chills and diaphoresis.  HENT: Negative for tinnitus and trouble swallowing.   Eyes: Negative for visual disturbance.  Respiratory: Negative for chest tightness, shortness of breath and wheezing.   Cardiovascular: Negative for chest pain, palpitations and leg swelling.  Gastrointestinal: Negative for nausea, vomiting, diarrhea, constipation and anal bleeding.  Endocrine: Negative for cold intolerance and heat intolerance.  Genitourinary: Positive for vaginal bleeding. Negative for vaginal discharge, difficulty urinating and vaginal pain.       Last a long time (2 wks)   Musculoskeletal: Positive for back pain. Negative for neck pain.       Low back pain from daily activity  Skin: Negative for color change and rash.  Neurological: Negative for dizziness, weakness, numbness and headaches.  Hematological: Does not bruise/bleed easily.  Psychiatric/Behavioral: Positive for sleep disturbance. Negative for suicidal ideas. The patient is not nervous/anxious.    Past Medical History  Diagnosis Date  . Breast mass 2014    left breast 12 o'clcok  . Chicken pox   . Genital herpes     History   Social History    . Marital Status: Married    Spouse Name: Janete Quilling  . Number of Children: 2  . Years of Education: 18   Occupational History  . School Counselor     [employer]   Social History Main Topics  . Smoking status: Former Smoker    Types: Cigarettes  . Smokeless tobacco: Never Used  . Alcohol Use: Yes     Comment: occasionally - maybe 1 drink/week  . Drug Use: No  . Sexual Activity: Not on file   Other Topics Concern  . Not on file   Social History Narrative   [place]rena [place] up "[place] over". Her father moved approximately every 5 years for job opportunities. She attended [employer] for her Bachelor's in Health and PE. She then attended [employer] on line for her Master's in [employer]. She works as a Animal nutritionist for [employer]. She currently lives at home with her husband, Aaron Edelman, and two sons (Aiden, Roxy Manns). They have a family dog named R.J. She enjoys the outdoors.      Caffeine - 1 daily   No smoking   Not currently exercising. Plans on starting   Diet is fairly healthy.    Past Surgical History  Procedure Laterality Date  . Knee surgery Left 1992    Torn meniscus & ACL  . Eye surgery  2008    Lasix  . Breast surgery Left 2014  Biopsy - benign    Family History  Problem Relation Age of Onset  . Hypertension Mother   . Heart disease Father     CAD - Quadruple bypass  . Hypertension Father   . Diabetes Paternal Grandfather   . Cancer Maternal Grandfather 87    Colon Cancer    Allergies  Allergen Reactions  . Codeine Palpitations    Current Outpatient Prescriptions on File Prior to Visit  Medication Sig Dispense Refill  . Norgestimate-Ethinyl Estradiol Triphasic 0.18/0.215/0.25 MG-25 MCG tab Take 1 tablet by mouth daily. 3 Package 4   No current facility-administered medications on file prior to visit.      Objective:   Physical Exam  Constitutional: She is oriented to person, place, and time. She appears  well-developed and well-nourished. No distress.  BP 110/74 mmHg  Pulse 91  Temp(Src) 98.2 F (36.8 C) (Oral)  Resp 12  Ht 5\' 8"  (1.727 m)  Wt 185 lb 12.8 oz (84.278 kg)  BMI 28.26 kg/m2  SpO2 97%  LMP 11/14/2014 (Approximate)   HENT:  Head: Normocephalic and atraumatic.  Right Ear: External ear normal.  Left Ear: External ear normal.  Nose: Nose normal.  Mouth/Throat: Oropharynx is clear and moist. No oropharyngeal exudate.  TMs and canals clear bilaterally  Eyes: Conjunctivae and EOM are normal. Pupils are equal, round, and reactive to light. Right eye exhibits no discharge. Left eye exhibits no discharge. No scleral icterus.  Neck: Normal range of motion. Neck supple. No thyromegaly present.  Cardiovascular: Normal rate, regular rhythm, normal heart sounds and intact distal pulses.  Exam reveals no gallop and no friction rub.   No murmur heard. Pulmonary/Chest: Effort normal and breath sounds normal. No respiratory distress. She has no wheezes. She has no rales. She exhibits no tenderness.  Abdominal: Soft. Bowel sounds are normal. She exhibits no distension and no mass. There is no tenderness. There is no rebound and no guarding.  Striae on abdomen  Genitourinary:  Deferred- has OB/GYN  Musculoskeletal: Normal range of motion. She exhibits no edema or tenderness.  Lymphadenopathy:    She has no cervical adenopathy.  Neurological: She is alert and oriented to person, place, and time. She has normal reflexes. No cranial nerve deficit. She exhibits normal muscle tone. Coordination normal.  Skin: Skin is warm and dry. No rash noted. She is not diaphoretic. No erythema. No pallor.  Psychiatric: She has a normal mood and affect. Her behavior is normal. Judgment and thought content normal.      Assessment & Plan:

## 2014-11-14 NOTE — Assessment & Plan Note (Signed)
Fatigue worsening over past 2 weeks. Feels very tired around 1 pm and not sleeping through the night. Will check labs first. Suggested Vitamin B complex

## 2014-11-14 NOTE — Patient Instructions (Addendum)
See you next year for your physical. You can call anytime for other concerns or visits.   Make your fasting lab appointment before leaving.   Vitamin B complex (any brand) OTC may help with energy. Don't start until after labs are drawn.   Health Maintenance Adopting a healthy lifestyle and getting preventive care can go a long way to promote health and wellness. Talk with your health care provider about what schedule of regular examinations is right for you. This is a good chance for you to check in with your provider about disease prevention and staying healthy. In between checkups, there are plenty of things you can do on your own. Experts have done a lot of research about which lifestyle changes and preventive measures are most likely to keep you healthy. Ask your health care provider for more information. WEIGHT AND DIET  Eat a healthy diet  Be sure to include plenty of vegetables, fruits, low-fat dairy products, and lean protein.  Do not eat a lot of foods high in solid fats, added sugars, or salt.  Get regular exercise. This is one of the most important things you can do for your health.  Most adults should exercise for at least 150 minutes each week. The exercise should increase your heart rate and make you sweat (moderate-intensity exercise).  Most adults should also do strengthening exercises at least twice a week. This is in addition to the moderate-intensity exercise.  Maintain a healthy weight  Body mass index (BMI) is a measurement that can be used to identify possible weight problems. It estimates body fat based on height and weight. Your health care provider can help determine your BMI and help you achieve or maintain a healthy weight.  For females 58 years of age and older:   A BMI below 18.5 is considered underweight.  A BMI of 18.5 to 24.9 is normal.  A BMI of 25 to 29.9 is considered overweight.  A BMI of 30 and above is considered obese.  Watch levels of  cholesterol and blood lipids  You should start having your blood tested for lipids and cholesterol at 37 years of age, then have this test every 5 years.  You may need to have your cholesterol levels checked more often if:  Your lipid or cholesterol levels are high.  You are older than 37 years of age.  You are at high risk for heart disease.  CANCER SCREENING   Lung Cancer  Lung cancer screening is recommended for adults 23-17 years old who are at high risk for lung cancer because of a history of smoking.  A yearly low-dose CT scan of the lungs is recommended for people who:  Currently smoke.  Have quit within the past 15 years.  Have at least a 30-pack-year history of smoking. A pack year is smoking an average of one pack of cigarettes a day for 1 year.  Yearly screening should continue until it has been 15 years since you quit.  Yearly screening should stop if you develop a health problem that would prevent you from having lung cancer treatment.  Breast Cancer  Practice breast self-awareness. This means understanding how your breasts normally appear and feel.  It also means doing regular breast self-exams. Let your health care provider know about any changes, no matter how small.  If you are in your 20s or 30s, you should have a clinical breast exam (CBE) by a health care provider every 1-3 years as part of a regular  health exam.  If you are 40 or older, have a CBE every year. Also consider having a breast X-ray (mammogram) every year.  If you have a family history of breast cancer, talk to your health care provider about genetic screening.  If you are at high risk for breast cancer, talk to your health care provider about having an MRI and a mammogram every year.  Breast cancer gene (BRCA) assessment is recommended for women who have family members with BRCA-related cancers. BRCA-related cancers include:  Breast.  Ovarian.  Tubal.  Peritoneal  cancers.  Results of the assessment will determine the need for genetic counseling and BRCA1 and BRCA2 testing. Cervical Cancer Routine pelvic examinations to screen for cervical cancer are no longer recommended for nonpregnant women who are considered low risk for cancer of the pelvic organs (ovaries, uterus, and vagina) and who do not have symptoms. A pelvic examination may be necessary if you have symptoms including those associated with pelvic infections. Ask your health care provider if a screening pelvic exam is right for you.   The Pap test is the screening test for cervical cancer for women who are considered at risk.  If you had a hysterectomy for a problem that was not cancer or a condition that could lead to cancer, then you no longer need Pap tests.  If you are older than 65 years, and you have had normal Pap tests for the past 10 years, you no longer need to have Pap tests.  If you have had past treatment for cervical cancer or a condition that could lead to cancer, you need Pap tests and screening for cancer for at least 20 years after your treatment.  If you no longer get a Pap test, assess your risk factors if they change (such as having a new sexual partner). This can affect whether you should start being screened again.  Some women have medical problems that increase their chance of getting cervical cancer. If this is the case for you, your health care provider may recommend more frequent screening and Pap tests.  The human papillomavirus (HPV) test is another test that may be used for cervical cancer screening. The HPV test looks for the virus that can cause cell changes in the cervix. The cells collected during the Pap test can be tested for HPV.  The HPV test can be used to screen women 34 years of age and older. Getting tested for HPV can extend the interval between normal Pap tests from three to five years.  An HPV test also should be used to screen women of any age who  have unclear Pap test results.  After 37 years of age, women should have HPV testing as often as Pap tests.  Colorectal Cancer  This type of cancer can be detected and often prevented.  Routine colorectal cancer screening usually begins at 37 years of age and continues through 37 years of age.  Your health care provider may recommend screening at an earlier age if you have risk factors for colon cancer.  Your health care provider may also recommend using home test kits to check for hidden blood in the stool.  A small camera at the end of a tube can be used to examine your colon directly (sigmoidoscopy or colonoscopy). This is done to check for the earliest forms of colorectal cancer.  Routine screening usually begins at age 90.  Direct examination of the colon should be repeated every 5-10 years through 37 years  of age. However, you may need to be screened more often if early forms of precancerous polyps or small growths are found. Skin Cancer  Check your skin from head to toe regularly.  Tell your health care provider about any new moles or changes in moles, especially if there is a change in a mole's shape or color.  Also tell your health care provider if you have a mole that is larger than the size of a pencil eraser.  Always use sunscreen. Apply sunscreen liberally and repeatedly throughout the day.  Protect yourself by wearing long sleeves, pants, a wide-brimmed hat, and sunglasses whenever you are outside. HEART DISEASE, DIABETES, AND HIGH BLOOD PRESSURE   Have your blood pressure checked at least every 1-2 years. High blood pressure causes heart disease and increases the risk of stroke.  If you are between 49 years and 39 years old, ask your health care provider if you should take aspirin to prevent strokes.  Have regular diabetes screenings. This involves taking a blood sample to check your fasting blood sugar level.  If you are at a normal weight and have a low risk for  diabetes, have this test once every three years after 37 years of age.  If you are overweight and have a high risk for diabetes, consider being tested at a younger age or more often. PREVENTING INFECTION  Hepatitis B  If you have a higher risk for hepatitis B, you should be screened for this virus. You are considered at high risk for hepatitis B if:  You were born in a country where hepatitis B is common. Ask your health care provider which countries are considered high risk.  Your parents were born in a high-risk country, and you have not been immunized against hepatitis B (hepatitis B vaccine).  You have HIV or AIDS.  You use needles to inject street drugs.  You live with someone who has hepatitis B.  You have had sex with someone who has hepatitis B.  You get hemodialysis treatment.  You take certain medicines for conditions, including cancer, organ transplantation, and autoimmune conditions. Hepatitis C  Blood testing is recommended for:  Everyone born from [birth year] through 1963.  Anyone with known risk factors for hepatitis C. Sexually transmitted infections (STIs)  You should be screened for sexually transmitted infections (STIs) including gonorrhea and chlamydia if:  You are sexually active and are younger than 37 years of age.  You are older than 37 years of age and your health care provider tells you that you are at risk for this type of infection.  Your sexual activity has changed since you were last screened and you are at an increased risk for chlamydia or gonorrhea. Ask your health care provider if you are at risk.  If you do not have HIV, but are at risk, it may be recommended that you take a prescription medicine daily to prevent HIV infection. This is called pre-exposure prophylaxis (PrEP). You are considered at risk if:  You are sexually active and do not regularly use condoms or know the HIV status of your partner(s).  You take drugs by injection.  You are  sexually active with a partner who has HIV. Talk with your health care provider about whether you are at high risk of being infected with HIV. If you choose to begin PrEP, you should first be tested for HIV. You should then be tested every 3 months for as long as you are taking PrEP.  PREGNANCY  If you are premenopausal and you may become pregnant, ask your health care provider about preconception counseling.  If you may become pregnant, take 400 to 800 micrograms (mcg) of folic acid every day.  If you want to prevent pregnancy, talk to your health care provider about birth control (contraception). OSTEOPOROSIS AND MENOPAUSE   Osteoporosis is a disease in which the bones lose minerals and strength with aging. This can result in serious bone fractures. Your risk for osteoporosis can be identified using a bone density scan.  If you are 62 years of age or older, or if you are at risk for osteoporosis and fractures, ask your health care provider if you should be screened.  Ask your health care provider whether you should take a calcium or vitamin D supplement to lower your risk for osteoporosis.  Menopause may have certain physical symptoms and risks.  Hormone replacement therapy may reduce some of these symptoms and risks. Talk to your health care provider about whether hormone replacement therapy is right for you.  HOME CARE INSTRUCTIONS   Schedule regular health, dental, and eye exams.  Stay current with your immunizations.   Do not use any tobacco products including cigarettes, chewing tobacco, or electronic cigarettes.  If you are pregnant, do not drink alcohol.  If you are breastfeeding, limit how much and how often you drink alcohol.  Limit alcohol intake to no more than 1 drink per day for nonpregnant women. One drink equals 12 ounces of beer, 5 ounces of wine, or 1 ounces of hard liquor.  Do not use street drugs.  Do not share needles.  Ask your health care provider for  help if you need support or information about quitting drugs.  Tell your health care provider if you often feel depressed.  Tell your health care provider if you have ever been abused or do not feel safe at home. Document Released: 12/27/2010 Document Revised: 10/28/2013 Document Reviewed: 05/15/2013 [facility] Patient Information 2015 [place], [place]. This information is not intended to replace advice given to you by your health care provider. Make sure you discuss any questions you have with your health care provider.

## 2014-11-14 NOTE — Assessment & Plan Note (Signed)
Discussed acute and chronic issues. Reviewed health maintenance measures, PFSHx, and immunizations. Obtain routine labs TSH, Lipid panel, CBC w/ diff, A1c, and CMET. Added full thyroid work up, B12, and Vitamin D levels for fatigue.

## 2014-11-14 NOTE — Progress Notes (Signed)
Pre visit review using our clinic review tool, if applicable. No additional management support is needed unless otherwise documented below in the visit note. 

## 2014-11-19 ENCOUNTER — Other Ambulatory Visit: Payer: BC Managed Care – PPO

## 2014-11-20 ENCOUNTER — Ambulatory Visit
Admission: RE | Admit: 2014-11-20 | Discharge: 2014-11-20 | Disposition: A | Discharge status: Home / Self Care | Payer: BC Managed Care – PPO | Source: Ambulatory Visit | Attending: Nurse Practitioner | Admitting: Nurse Practitioner

## 2014-11-20 ENCOUNTER — Other Ambulatory Visit (INDEPENDENT_AMBULATORY_CARE_PROVIDER_SITE_OTHER): Payer: BC Managed Care – PPO

## 2014-11-20 ENCOUNTER — Ambulatory Visit
Admission: RE | Admit: 2014-11-20 | Discharge: 2014-11-20 | Disposition: A | Discharge status: Home / Self Care | Payer: BC Managed Care – PPO | Source: Ambulatory Visit | Attending: Internal Medicine | Admitting: Internal Medicine

## 2014-11-20 DIAGNOSIS — R5383 Other fatigue: Secondary | ICD-10-CM

## 2014-11-20 DIAGNOSIS — N6320 Unspecified lump in the left breast, unspecified quadrant: Secondary | ICD-10-CM

## 2014-11-20 DIAGNOSIS — Z0000 Encounter for general adult medical examination without abnormal findings: Secondary | ICD-10-CM

## 2014-11-20 DIAGNOSIS — Z1329 Encounter for screening for other suspected endocrine disorder: Secondary | ICD-10-CM | POA: Diagnosis not present

## 2014-11-20 DIAGNOSIS — Z130 Encounter for screening for diseases of the blood and blood-forming organs and certain disorders involving the immune mechanism: Secondary | ICD-10-CM

## 2014-11-20 DIAGNOSIS — N63 Unspecified lump in breast: Secondary | ICD-10-CM | POA: Insufficient documentation

## 2014-11-20 DIAGNOSIS — N6002 Solitary cyst of left breast: Secondary | ICD-10-CM

## 2014-11-20 DIAGNOSIS — Z13220 Encounter for screening for lipoid disorders: Secondary | ICD-10-CM | POA: Diagnosis not present

## 2014-11-20 LAB — COMPREHENSIVE METABOLIC PANEL
ALT: 9 U/L (ref 0–35)
AST: 12 U/L (ref 0–37)
Albumin: 4 g/dL (ref 3.5–5.2)
Alkaline Phosphatase: 45 U/L (ref 39–117)
BILIRUBIN TOTAL: 1.2 mg/dL (ref 0.2–1.2)
BUN: 8 mg/dL (ref 6–23)
CALCIUM: 9.3 mg/dL (ref 8.4–10.5)
CHLORIDE: 103 meq/L (ref 96–112)
CO2: 29 mEq/L (ref 19–32)
CREATININE: 0.94 mg/dL (ref 0.40–1.20)
GFR: 71.21 mL/min (ref >60.00)
Glucose, Bld: 103 mg/dL — ABNORMAL HIGH (ref 70–99)
POTASSIUM: 4.2 meq/L (ref 3.5–5.1)
Sodium: 136 mEq/L (ref 135–145)
TOTAL PROTEIN: 6.8 g/dL (ref 6.0–8.3)

## 2014-11-20 LAB — CBC WITH DIFFERENTIAL/PLATELET
Basophils Absolute: 0 10*3/uL (ref 0.0–0.1)
Basophils Relative: 0.7 % (ref 0.0–3.0)
EOS ABS: 0.1 10*3/uL (ref 0.0–0.7)
Eosinophils Relative: 0.9 % (ref 0.0–5.0)
HCT: 40.6 % (ref 36.0–46.0)
Hemoglobin: 13.8 g/dL (ref 12.0–15.0)
LYMPHS PCT: 36.9 % (ref 12.0–46.0)
Lymphs Abs: 2.1 10*3/uL (ref 0.7–4.0)
MCHC: 34.1 g/dL (ref 30.0–36.0)
MCV: 92.2 fl (ref 78.0–100.0)
MONOS PCT: 5.9 % (ref 3.0–12.0)
Monocytes Absolute: 0.3 10*3/uL (ref 0.1–1.0)
NEUTROS ABS: 3.2 10*3/uL (ref 1.4–7.7)
Neutrophils Relative %: 55.6 % (ref 43.0–77.0)
PLATELETS: 174 10*3/uL (ref 150.0–400.0)
RBC: 4.4 Mil/uL (ref 3.87–5.11)
RDW: 12.7 % (ref 11.5–15.5)
WBC: 5.7 10*3/uL (ref 4.0–10.5)

## 2014-11-20 LAB — VITAMIN D 25 HYDROXY (VIT D DEFICIENCY, FRACTURES): VITD: 17.51 ng/mL — AB (ref 30.00–100.00)

## 2014-11-20 LAB — TSH: TSH: 1.42 u[IU]/mL (ref 0.35–4.50)

## 2014-11-20 LAB — VITAMIN B12: Vitamin B-12: 227 pg/mL (ref 211–911)

## 2014-11-20 LAB — LIPID PANEL
Cholesterol: 154 mg/dL (ref 0–200)
HDL: 68.6 mg/dL (ref >39.00)
LDL Cholesterol: 64 mg/dL (ref 0–99)
NONHDL: 85.4
TRIGLYCERIDES: 106 mg/dL (ref 0.0–149.0)
Total CHOL/HDL Ratio: 2
VLDL: 21.2 mg/dL (ref 0.0–40.0)

## 2014-11-20 LAB — T3: T3, Total: 115.1 ng/dL (ref 80.0–204.0)

## 2014-11-20 LAB — T4, FREE: FREE T4: 0.78 ng/dL (ref 0.60–1.60)

## 2014-11-21 ENCOUNTER — Other Ambulatory Visit: Payer: Self-pay | Admitting: Nurse Practitioner

## 2014-11-21 MED ORDER — VITAMIN D (ERGOCALCIFEROL) 1.25 MG (50000 UNIT) PO CAPS: 50000.0000 [IU] | ORAL_CAPSULE | ORAL | Status: DC

## 2014-12-17 ENCOUNTER — Telehealth: Payer: Self-pay | Admitting: *Deleted

## 2014-12-17 NOTE — Telephone Encounter (Signed)
Pt called states she received a denial from her insurance for her recent mammogram.  States it advised her to get a letter of medical necessity from the MD.  Please advise

## 2014-12-17 NOTE — Telephone Encounter (Signed)
Pt dropped off letter stating the denial of a bilateral digital breast tomosynthesis. Pt stated that if the md writes a letter state the medical necessity of the procedure that it would be approved. Letter in [place] box/msn

## 2015-01-02 NOTE — Telephone Encounter (Signed)
I still have not seen this form. Could we check into this? Thanks!

## 2015-03-09 ENCOUNTER — Ambulatory Visit (INDEPENDENT_AMBULATORY_CARE_PROVIDER_SITE_OTHER): Payer: BC Managed Care – PPO | Admitting: Nurse Practitioner

## 2015-03-09 VITALS — BP 100/68 | HR 63 | Temp 97.8°F | Resp 14 | Ht 68.0 in | Wt 185.8 lb

## 2015-03-09 DIAGNOSIS — M7751 Other enthesopathy of right foot: Secondary | ICD-10-CM

## 2015-03-09 NOTE — Progress Notes (Signed)
Pre visit review using our clinic review tool, if applicable. No additional management support is needed unless otherwise documented below in the visit note. 

## 2015-03-09 NOTE — Patient Instructions (Signed)
Plantar Fasciitis (Heel Spur Syndrome) with Rehab The plantar fascia is a fibrous, ligament-like, soft-tissue structure that spans the bottom of the foot. Plantar fasciitis is a condition that causes pain in the foot due to inflammation of the tissue. SYMPTOMS   Pain and tenderness on the underneath side of the foot.  Pain that worsens with standing or walking. CAUSES  Plantar fasciitis is caused by irritation and injury to the plantar fascia on the underneath side of the foot. Common mechanisms of injury include:  Direct trauma to bottom of the foot.  Damage to a small nerve that runs under the foot where the main fascia attaches to the heel bone.  Stress placed on the plantar fascia due to bone spurs. RISK INCREASES WITH:   Activities that place stress on the plantar fascia (running, jumping, pivoting, or cutting).  Poor strength and flexibility.  Improperly fitted shoes.  Tight calf muscles.  Flat feet.  Failure to warm-up properly before activity.  Obesity. PREVENTION  Warm up and stretch properly before activity.  Allow for adequate recovery between workouts.  Maintain physical fitness:  Strength, flexibility, and endurance.  Cardiovascular fitness.  Maintain a health body weight.  Avoid stress on the plantar fascia.  Wear properly fitted shoes, including arch supports for individuals who have flat feet. PROGNOSIS  If treated properly, then the symptoms of plantar fasciitis usually resolve without surgery. However, occasionally surgery is necessary. RELATED COMPLICATIONS   Recurrent symptoms that may result in a chronic condition.  Problems of the lower back that are caused by compensating for the injury, such as limping.  Pain or weakness of the foot during push-off following surgery.  Chronic inflammation, scarring, and partial or complete fascia tear, occurring more often from repeated injections. TREATMENT  Treatment initially involves the use of  ice and medication to help reduce pain and inflammation. The use of strengthening and stretching exercises may help reduce pain with activity, especially stretches of the Achilles tendon. These exercises may be performed at home or with a therapist. Your caregiver may recommend that you use heel cups of arch supports to help reduce stress on the plantar fascia. Occasionally, corticosteroid injections are given to reduce inflammation. If symptoms persist for greater than 6 months despite non-surgical (conservative), then surgery may be recommended.  MEDICATION   If pain medication is necessary, then nonsteroidal anti-inflammatory medications, such as aspirin and ibuprofen, or other minor pain relievers, such as acetaminophen, are often recommended.  Do not take pain medication within 7 days before surgery.  Prescription pain relievers may be given if deemed necessary by your caregiver. Use only as directed and only as much as you need.  Corticosteroid injections may be given by your caregiver. These injections should be reserved for the most serious cases, because they may only be given a certain number of times. HEAT AND COLD  Cold treatment (icing) relieves pain and reduces inflammation. Cold treatment should be applied for 10 to 15 minutes every 2 to 3 hours for inflammation and pain and immediately after any activity that aggravates your symptoms. Use ice packs or massage the area with a piece of ice (ice massage).  Heat treatment may be used prior to performing the stretching and strengthening activities prescribed by your caregiver, physical therapist, or athletic trainer. Use a heat pack or soak the injury in warm water. SEEK IMMEDIATE MEDICAL CARE IF:  Treatment seems to offer no benefit, or the condition worsens.  Any medications produce adverse side effects. EXERCISES RANGE   OF MOTION (ROM) AND STRETCHING EXERCISES - Plantar Fasciitis (Heel Spur Syndrome) These exercises may help you  when beginning to rehabilitate your injury. Your symptoms may resolve with or without further involvement from your physician, physical therapist or athletic trainer. While completing these exercises, remember:   Restoring tissue flexibility helps normal motion to return to the joints. This allows healthier, less painful movement and activity.  An effective stretch should be held for at least 30 seconds.  A stretch should never be painful. You should only feel a gentle lengthening or release in the stretched tissue. RANGE OF MOTION - Toe Extension, Flexion  Sit with your right / left leg crossed over your opposite knee.  Grasp your toes and gently pull them back toward the top of your foot. You should feel a stretch on the bottom of your toes and/or foot.  Hold this stretch for __________ seconds.  Now, gently pull your toes toward the bottom of your foot. You should feel a stretch on the top of your toes and or foot.  Hold this stretch for __________ seconds. Repeat __________ times. Complete this stretch __________ times per day.  RANGE OF MOTION - Ankle Dorsiflexion, Active Assisted  Remove shoes and sit on a chair that is preferably not on a carpeted surface.  Place right / left foot under knee. Extend your opposite leg for support.  Keeping your heel down, slide your right / left foot back toward the chair until you feel a stretch at your ankle or calf. If you do not feel a stretch, slide your bottom forward to the edge of the chair, while still keeping your heel down.  Hold this stretch for __________ seconds. Repeat __________ times. Complete this stretch __________ times per day.  STRETCH - Gastroc, Standing  Place hands on wall.  Extend right / left leg, keeping the front knee somewhat bent.  Slightly point your toes inward on your back foot.  Keeping your right / left heel on the floor and your knee straight, shift your weight toward the wall, not allowing your back to  arch.  You should feel a gentle stretch in the right / left calf. Hold this position for __________ seconds. Repeat __________ times. Complete this stretch __________ times per day. STRETCH - Soleus, Standing  Place hands on wall.  Extend right / left leg, keeping the other knee somewhat bent.  Slightly point your toes inward on your back foot.  Keep your right / left heel on the floor, bend your back knee, and slightly shift your weight over the back leg so that you feel a gentle stretch deep in your back calf.  Hold this position for __________ seconds. Repeat __________ times. Complete this stretch __________ times per day. STRETCH - Gastrocsoleus, Standing  Note: This exercise can place a lot of stress on your foot and ankle. Please complete this exercise only if specifically instructed by your caregiver.   Place the ball of your right / left foot on a step, keeping your other foot firmly on the same step.  Hold on to the wall or a rail for balance.  Slowly lift your other foot, allowing your body weight to press your heel down over the edge of the step.  You should feel a stretch in your right / left calf.  Hold this position for __________ seconds.  Repeat this exercise with a slight bend in your right / left knee. Repeat __________ times. Complete this stretch __________ times per day.    STRENGTHENING EXERCISES - Plantar Fasciitis (Heel Spur Syndrome)  These exercises may help you when beginning to rehabilitate your injury. They may resolve your symptoms with or without further involvement from your physician, physical therapist or athletic trainer. While completing these exercises, remember:   Muscles can gain both the endurance and the strength needed for everyday activities through controlled exercises.  Complete these exercises as instructed by your physician, physical therapist or athletic trainer. Progress the resistance and repetitions only as guided. STRENGTH -  Towel Curls  Sit in a chair positioned on a non-carpeted surface.  Place your foot on a towel, keeping your heel on the floor.  Pull the towel toward your heel by only curling your toes. Keep your heel on the floor.  If instructed by your physician, physical therapist or athletic trainer, add ____________________ at the end of the towel. Repeat __________ times. Complete this exercise __________ times per day. STRENGTH - Ankle Inversion  Secure one end of a rubber exercise band/tubing to a fixed object (table, pole). Loop the other end around your foot just before your toes.  Place your fists between your knees. This will focus your strengthening at your ankle.  Slowly, pull your big toe up and in, making sure the band/tubing is positioned to resist the entire motion.  Hold this position for __________ seconds.  Have your muscles resist the band/tubing as it slowly pulls your foot back to the starting position. Repeat __________ times. Complete this exercises __________ times per day.  Document Released: 06/13/2005 Document Revised: 09/05/2011 Document Reviewed: 09/25/2008 [employer] Patient Information 2015 [employer]. This information is not intended to replace advice given to you by your health care provider. Make sure you discuss any questions you have with your health care provider.  

## 2015-03-09 NOTE — Progress Notes (Signed)
Patient ID: Emily Jensen, female    DOB: [birth year]  Age: 39 y.o. MRN: [id number]  CC: Foot Pain   HPI Emily Jensen presents for CC of right foot heel pain.   1) Burning sensation x 3 months in right retrocalcaneal area. Worse in the am when first standing and with sitting for "awhile". She feels a throbbing/burning sensation. Treatment to date includes: icing heel, ibuprofen, rest, and rolling ball of foot over golf ball. Nothing is a relieving factor at this time she reports. Moderate severity.   History Emily Jensen has a past medical history of Breast mass (2014); Chicken pox; and Genital herpes.   She has past surgical history that includes Knee surgery (Left, 1992); Eye surgery (2008); Breast surgery (Left, 2014); and Breast biopsy (Left, 10/11/2012).   Her family history includes Cancer (age of onset: 35) in her maternal grandfather; Diabetes in her paternal grandfather; Heart disease in her father; Hypertension in her father and mother.She reports that she has quit smoking. Her smoking use included Cigarettes. She has never used smokeless tobacco. She reports that she drinks alcohol. She reports that she does not use illicit drugs.  Outpatient Prescriptions Prior to Visit  Medication Sig Dispense Refill  . Norgestimate-Ethinyl Estradiol Triphasic 0.18/0.215/0.25 MG-25 MCG tab Take 1 tablet by mouth daily. 3 Package 4  . Vitamin D, Ergocalciferol, (DRISDOL) 50000 UNITS CAPS capsule Take 1 capsule (50,000 Units total) by mouth every 7 (seven) days. 5 capsule 2   No facility-administered medications prior to visit.    ROS Review of Systems  Constitutional: Negative for fever, chills, diaphoresis and fatigue.  Cardiovascular: Positive for leg swelling.  Musculoskeletal: Positive for myalgias, arthralgias and gait problem.       Pain of retrocalcaneal area  Skin: Negative for rash.  Neurological: Negative for dizziness, weakness, numbness and headaches.  Psychiatric/Behavioral: The  patient is not nervous/anxious.     Objective:  BP 100/68 mmHg  Pulse 63  Temp(Src) 97.8 F (36.6 C)  Resp 14  Ht 5\' 8"  (1.727 m)  Wt 185 lb 12.8 oz (84.278 kg)  BMI 28.26 kg/m2  SpO2 98%  Physical Exam  Constitutional: She is oriented to person, place, and time. She appears well-developed and well-nourished. No distress.  HENT:  Head: Normocephalic and atraumatic.  Right Ear: External ear normal.  Left Ear: External ear normal.  Musculoskeletal: Normal range of motion. She exhibits tenderness. She exhibits no edema.  Tender to palpation of the retrocalcaneal area. Normal ROM of right foot at the ankle and toes.   Neurological: She is alert and oriented to person, place, and time. No cranial nerve deficit. She exhibits normal muscle tone. Coordination normal.  Skin: Skin is warm and dry. No rash noted. She is not diaphoretic.  Psychiatric: She has a normal mood and affect. Her behavior is normal. Judgment and thought content normal.    Assessment & Plan:   Emily Jensen was seen today for foot pain.  Diagnoses and all orders for this visit:  Retrocalcaneal bursitis, right -     Ambulatory referral to Podiatry  I am having Emily Jensen maintain her Norgestimate-Ethinyl Estradiol Triphasic and Vitamin D (Ergocalciferol).  No orders of the defined types were placed in this encounter.     Follow-up: Return if symptoms worsen or fail to improve.

## 2015-03-10 ENCOUNTER — Encounter: Payer: Self-pay | Admitting: Nurse Practitioner

## 2015-03-10 DIAGNOSIS — M7750 Other enthesopathy of unspecified foot: Secondary | ICD-10-CM | POA: Insufficient documentation

## 2015-03-10 NOTE — Assessment & Plan Note (Signed)
Probable and new onset. Can't r/out plantar fasciitis, also. Pt has failed conservative therapy and would like to be seen by podiatry. Will refer. FU prn worsening/failure to improve.

## 2015-03-21 ENCOUNTER — Other Ambulatory Visit: Payer: Self-pay | Admitting: Nurse Practitioner

## 2015-03-23 NOTE — Telephone Encounter (Signed)
Last OV was 9.12.16, next OV 10.3.16, do you want her to still take this?

## 2015-03-30 ENCOUNTER — Ambulatory Visit (INDEPENDENT_AMBULATORY_CARE_PROVIDER_SITE_OTHER): Payer: BC Managed Care – PPO

## 2015-03-30 ENCOUNTER — Ambulatory Visit (INDEPENDENT_AMBULATORY_CARE_PROVIDER_SITE_OTHER): Payer: BC Managed Care – PPO | Admitting: Podiatry

## 2015-03-30 ENCOUNTER — Encounter: Payer: Self-pay | Admitting: Podiatry

## 2015-03-30 VITALS — BP 106/73 | HR 69 | Resp 16 | Ht 68.0 in | Wt 180.0 lb

## 2015-03-30 DIAGNOSIS — M722 Plantar fascial fibromatosis: Secondary | ICD-10-CM

## 2015-03-30 DIAGNOSIS — M7661 Achilles tendinitis, right leg: Secondary | ICD-10-CM | POA: Diagnosis not present

## 2015-03-30 MED ORDER — MELOXICAM 15 MG PO TABS: 15.0000 mg | ORAL_TABLET | Freq: Every day | ORAL | Status: DC

## 2015-03-30 NOTE — Progress Notes (Signed)
   Subjective:    Patient ID: Emily Jensen, female    DOB: October 09, 1975, 39 y.o.   MRN: [id number]  HPI: She presents today with a chief complaint of a painful right posterior heel times the past 3 months. She states this started bothering her after she initiated a exercise regimen. She has tried resting it icing it and anti-inflammatory's. She states that seems to be doing much better and I really don't know why I'm here today. 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    Review of Systems  All other systems reviewed and are negative.      Objective:   Physical Exam: I have reviewed her past medical history medications allergies surgeon social history. 39 year old female in no apparent distress presents with painful right foot and an antalgic gait. Vital signs stable alert and oriented 3 pulses are strongly palpable. Neurologic sensorium is intact deep tendon reflexes are intact muscle strength +5 over 5 dorsiflexion plantar flexors and inverters everters tight gastroc right. Orthopedic evaluation and x-rays all joints distal to the ankle, full range of motion without crepitation. She has palpable fluctuance on the posterior aspect of the calcaneus which is tender to palpation. Radiographs do demonstrate no thickening of the Achilles. Soft tissue margins. Normal small plantar posterior calcaneal heel spurs. Cutaneous evaluation of Mr. supple well-hydrated cutis no erythema edema saline as drainage or odor.        Assessment & Plan:  Bursitis and Achilles tendinitis insertional right.  Plan: I wanted to put her on a prednisone pack but she declined. Started her on an meloxicam 15 mg 1 by mouth daily 30 days. Also placed her in a night splint and discussed appropriate shoe gear stretching icing and shoe modifications. Follow-up with her in 1 month if necessary.  Roselind Messier DPM

## 2015-03-30 NOTE — Patient Instructions (Signed)

## 2015-05-04 ENCOUNTER — Ambulatory Visit: Payer: BC Managed Care – PPO | Admitting: Podiatry

## 2015-08-04 ENCOUNTER — Encounter: Payer: Self-pay | Admitting: Family Medicine

## 2015-08-04 ENCOUNTER — Ambulatory Visit (INDEPENDENT_AMBULATORY_CARE_PROVIDER_SITE_OTHER): Payer: BC Managed Care – PPO | Admitting: Family Medicine

## 2015-08-04 VITALS — BP 116/78 | HR 99 | Temp 98.2°F | Wt 187.4 lb

## 2015-08-04 DIAGNOSIS — J069 Acute upper respiratory infection, unspecified: Secondary | ICD-10-CM | POA: Diagnosis not present

## 2015-08-04 NOTE — Assessment & Plan Note (Signed)
Patient's symptoms consistent with viral upper respiratory infection. No focal findings to indicate a bacterial infection. Vital signs stable. We'll treat with over-the-counter Flonase and Claritin or Zyrtec. Still hydrated. Nasal saline. Offered prednisone, though she declined. Given return precautions.

## 2015-08-04 NOTE — Progress Notes (Signed)
Pre visit review using our clinic review tool, if applicable. No additional management support is needed unless otherwise documented below in the visit note. 

## 2015-08-04 NOTE — Patient Instructions (Signed)
Nice to meet you. Your symptoms are likely related to a viral upper respiratory infection. You can use Flonase 2 sprays each nostril daily, nasal saline, Claritin or zyrtec, and stay well hydrated. If you develop fevers, cough productive of blood, trouble breathing, or any new or changing symptoms please seek medical attention.

## 2015-08-04 NOTE — Progress Notes (Signed)
Patient ID: KHELSEY KEPLER, female   DOB: [birth year], 40 y.o.   MRN: [id number]  Tommi Rumps, MD Phone: [phone]  Emily Jensen is a 40 y.o. female who presents today for same-day visit.  Patient notes onset of symptoms on Saturday. Notes she started out is not feeling good. Gradually her sinuses began to bother her and she developed clear rhinorrhea. Nonproductive cough. Clear mucus out of her nose. No fevers or shortness of breath. She has tried over-the-counter NyQuil and allergy medicines. No known sick contacts.  PMH: Former smoker.   ROS see history of present illness  Objective  Physical Exam Filed Vitals:   08/04/15 1112  BP: 116/78  Pulse: 99  Temp: 98.2 F (36.8 C)    BP Readings from Last 3 Encounters:  08/04/15 116/78  03/30/15 106/73  03/09/15 100/68   Wt Readings from Last 3 Encounters:  08/04/15 187 lb 6.4 oz (85.004 kg)  03/30/15 180 lb (81.647 kg)  03/09/15 185 lb 12.8 oz (84.278 kg)    Physical Exam  Constitutional: She is well-developed, well-nourished, and in no distress.  HENT:  Head: Normocephalic and atraumatic.  Right Ear: External ear normal.  Left Ear: External ear normal.  Mild postnasal drip, no tonsillar swelling or exudate normal TMs bilaterally  Eyes: Conjunctivae are normal. Pupils are equal, round, and reactive to light.  Neck: Neck supple.  Cardiovascular: Normal rate, regular rhythm and normal heart sounds.  Exam reveals no gallop and no friction rub.   No murmur heard. Pulmonary/Chest: Effort normal and breath sounds normal. No respiratory distress. She has no wheezes. She has no rales.  Lymphadenopathy:    She has no cervical adenopathy.  Neurological: She is alert. Gait normal.  Skin: Skin is warm and dry. She is not diaphoretic.     Assessment/Plan: Please see individual problem list.  Viral upper respiratory infection Patient's symptoms consistent with viral upper respiratory infection. No focal findings to  indicate a bacterial infection. Vital signs stable. We'll treat with over-the-counter Flonase and Claritin or Zyrtec. Still hydrated. Nasal saline. Offered prednisone, though she declined. Given return precautions.    Tommi Rumps

## 2015-11-16 ENCOUNTER — Ambulatory Visit (INDEPENDENT_AMBULATORY_CARE_PROVIDER_SITE_OTHER): Payer: BC Managed Care – PPO | Admitting: Family Medicine

## 2015-11-16 ENCOUNTER — Encounter: Payer: Self-pay | Admitting: Family Medicine

## 2015-11-16 ENCOUNTER — Encounter: Payer: BC Managed Care – PPO | Admitting: Nurse Practitioner

## 2015-11-16 VITALS — BP 108/68 | HR 80 | Temp 98.1°F | Ht 68.0 in | Wt 191.2 lb

## 2015-11-16 DIAGNOSIS — Z1329 Encounter for screening for other suspected endocrine disorder: Secondary | ICD-10-CM

## 2015-11-16 DIAGNOSIS — E559 Vitamin D deficiency, unspecified: Secondary | ICD-10-CM | POA: Diagnosis not present

## 2015-11-16 DIAGNOSIS — Z0000 Encounter for general adult medical examination without abnormal findings: Secondary | ICD-10-CM

## 2015-11-16 DIAGNOSIS — Z13220 Encounter for screening for lipoid disorders: Secondary | ICD-10-CM

## 2015-11-16 NOTE — Assessment & Plan Note (Signed)
Overall patient is doing well. Blood pressures in the normal range. She is obese. Discussed continuing exercise. Patient has no desire to work on her diet at this time and I voiced understanding. She will see a gynecologist for her Pap smears and breast exam. She is up-to-date on vaccinations. Up-to-date on HIV screening. She will return for lab work as outlined below.

## 2015-11-16 NOTE — Progress Notes (Signed)
Pre visit review using our clinic review tool, if applicable. No additional management support is needed unless otherwise documented below in the visit note. 

## 2015-11-16 NOTE — Progress Notes (Signed)
Patient ID: Emily Jensen, female   DOB: 1975/10/07, 40 y.o.   MRN: [id number]  Tommi Rumps, MD Phone: [phone]  Emily Jensen is a 40 y.o. female who presents today for physical exam.  Patient reports no concerns today. Exercises by doing Pilates most mornings. Runs 2 times a week. Weights once a week. Does this most weeks. Diet is a is not very good. Not much fruits and vegetables. Eats out a fair amount. Drink sweet tea every other time they doubt. Eats 3 meals a day. Protein with most meals. She has no desire to change her eating habits at this time. No she is 1 follow-up within an OB for her Pap smear and breast exam. Tetanus shot is up-to-date. HIV screening is up-to-date. Does report a history of mammography for a left breast cyst. States she was released last year to have her next mammogram at age 59. No illicit drug use. Drinks anywhere from 2 glasses of wine to a bottle of wine per week. No tobacco use. Vitamin D deficiency was found last year. Has not taken with supplement recently.  Active Ambulatory Problems    Diagnosis Date Noted  . Mood swings ([place]) 07/23/2013  . Dizziness 07/24/2013  . Breast cyst 10/28/2013  . Costochondritis 05/27/2014  . Irregular menstrual cycle 09/27/2014  . Other fatigue 11/14/2014  . Routine general medical examination at a health care facility 11/14/2014  . 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 Viral upper respiratory infection 08/04/2015   Resolved Ambulatory Problems    Diagnosis Date Noted  . 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 Breast mass 2014  . Chicken pox   . Genital herpes     Family History  Problem Relation Age of Onset  . Hypertension Mother   . Heart disease Father     CAD - Quadruple bypass  . Hypertension Father   . Diabetes Paternal Grandfather   . Cancer Maternal Grandfather 35    Colon Cancer    Social History   Social History  . Marital Status: Married    Spouse Name: Shiva Karis  . Number of Children: 2  . Years of Education: 18   Occupational History  . School Counselor     [employer]   Social History Main Topics  . Smoking status: Former Smoker    Types: Cigarettes  . Smokeless tobacco: Never Used  . Alcohol Use: Yes     Comment: occasionally - maybe 1 drink/week  . Drug Use: No  . Sexual Activity: Not on file   Other Topics Concern  . Not on file   Social History Narrative   Tanija [place] up "[place] over". Her father moved approximately every 5 years for job opportunities. She attended [employer] for her Bachelor's in Health and PE. She then attended [employer] on line for her Master's in [employer]. She works as a Animal nutritionist for [employer]. She currently lives at home with her husband, Aaron Edelman, and two sons (Aiden, Roxy Manns). They have a family dog named R.J. She enjoys the outdoors.      Caffeine - 1 daily   No smoking   Not currently exercising. Plans on starting   Diet is fairly healthy.    ROS  General:  Negative for nexplained weight loss, fever Skin: Negative for new or changing mole, sore that won't heal HEENT: Negative for trouble hearing, trouble seeing, ringing in ears, mouth sores, hoarseness, change  in voice, dysphagia. CV:  Negative for chest pain, dyspnea, edema, palpitations Resp: Negative for cough, dyspnea, hemoptysis GI: Negative for nausea, vomiting, diarrhea, constipation, abdominal pain, melena, hematochezia. GU: Negative for dysuria, incontinence, urinary hesitance, hematuria, vaginal or penile discharge, polyuria, sexual difficulty, lumps in testicle or breasts MSK: Negative for muscle cramps or aches, joint pain or swelling Neuro: Negative for headaches, weakness, numbness, dizziness, passing out/fainting Psych: Negative for depression, anxiety, memory problems  Objective  Physical Exam Filed Vitals:   11/16/15 1521  BP: 108/68  Pulse: 80  Temp: 98.1 F  (36.7 C)    BP Readings from Last 3 Encounters:  11/16/15 108/68  08/04/15 116/78  03/30/15 106/73   Wt Readings from Last 3 Encounters:  11/16/15 191 lb 3.2 oz (86.728 kg)  08/04/15 187 lb 6.4 oz (85.004 kg)  03/30/15 180 lb (81.647 kg)    Physical Exam  Constitutional: She is well-developed, well-nourished, and in no distress.  HENT:  Head: Normocephalic and atraumatic.  Right Ear: External ear normal.  Left Ear: External ear normal.  Mouth/Throat: Oropharynx is clear and moist. No oropharyngeal exudate.  Eyes: Conjunctivae are normal. Pupils are equal, round, and reactive to light.  Cardiovascular: Normal rate, regular rhythm and normal heart sounds.   Pulmonary/Chest: Effort normal and breath sounds normal.  Abdominal: Soft. Bowel sounds are normal. She exhibits no distension. There is no tenderness. There is no rebound and no guarding.  Musculoskeletal: She exhibits no edema.  Neurological: She is alert. Gait normal.  Skin: Skin is warm and dry. She is not diaphoretic.  Psychiatric: Mood and affect normal.     Assessment/Plan:   Routine general medical examination at a health care facility Overall patient is doing well. Blood pressures in the normal range. She is obese. Discussed continuing exercise. Patient has no desire to work on her diet at this time and I voiced understanding. She will see a gynecologist for her Pap smears and breast exam. She is up-to-date on vaccinations. Up-to-date on HIV screening. She will return for lab work as outlined below.    Orders Placed This Encounter  Procedures  . TSH    Standing Status: Future     Number of Occurrences:      Standing Expiration Date: 11/15/2016  . Comp Met (CMET)    Standing Status: Future     Number of Occurrences:      Standing Expiration Date: 11/15/2016  . Lipid Profile    Standing Status: Future     Number of Occurrences:      Standing Expiration Date: 11/15/2016  . Vitamin D (25 hydroxy)    Standing  Status: Future     Number of Occurrences:      Standing Expiration Date: 11/15/2016    No orders of the defined types were placed in this encounter.     Tommi Rumps, MD [place]

## 2015-11-16 NOTE — Patient Instructions (Signed)
Nice to meet you. Please continue to exercise. The goal is 150 minutes per week. Please try to add in fruits and vegetables to her diet. Please try to decrease any junk food intake or eating out. We will check lab work today and call with the results. Please call to get set up with a gynecologist for her Pap smears.

## 2015-11-27 ENCOUNTER — Other Ambulatory Visit: Payer: Self-pay

## 2015-11-27 NOTE — Telephone Encounter (Signed)
Birth control refill request denied due to patient not taking at last appointment.  If she needs a refill she needs an appt.

## 2015-11-30 ENCOUNTER — Other Ambulatory Visit: Payer: Self-pay

## 2015-11-30 MED ORDER — NORGESTIM-ETH ESTRAD TRIPHASIC 0.18/0.215/0.25 MG-25 MCG PO TABS: 1.0000 | ORAL_TABLET | Freq: Every day | ORAL | Status: DC

## 2015-11-30 NOTE — Telephone Encounter (Signed)
Birth control refilled upon further review of chart. Pt given one year worth of BC on 09/20/2014. Last filled 09/03/2015 with 3 month supply; pt should be due for refill this month.

## 2015-12-08 ENCOUNTER — Other Ambulatory Visit: Payer: BC Managed Care – PPO

## 2016-11-16 ENCOUNTER — Encounter: Payer: BC Managed Care – PPO | Admitting: Family Medicine

## 2017-03-20 ENCOUNTER — Encounter: Payer: Self-pay | Admitting: Maternal Newborn

## 2017-03-20 ENCOUNTER — Telehealth: Payer: Self-pay | Admitting: Certified Nurse Midwife

## 2017-03-20 ENCOUNTER — Ambulatory Visit (INDEPENDENT_AMBULATORY_CARE_PROVIDER_SITE_OTHER): Payer: BC Managed Care – PPO | Admitting: Maternal Newborn

## 2017-03-20 VITALS — BP 102/62 | HR 76 | Ht 68.0 in | Wt 181.0 lb

## 2017-03-20 DIAGNOSIS — Z3009 Encounter for other general counseling and advice on contraception: Secondary | ICD-10-CM | POA: Diagnosis not present

## 2017-03-20 DIAGNOSIS — N921 Excessive and frequent menstruation with irregular cycle: Secondary | ICD-10-CM

## 2017-03-20 DIAGNOSIS — N926 Irregular menstruation, unspecified: Secondary | ICD-10-CM

## 2017-03-20 NOTE — Telephone Encounter (Signed)
10/24 at 8:50 for mirena insert with clg

## 2017-03-20 NOTE — Progress Notes (Signed)
Obstetrics & Gynecology Office Visit   Chief Complaint:  Chief Complaint  Patient presents with  . Contraception    birth control conference    History of Present Illness: Patient has had irregular periods since menarche. Her current periods are irregular, occur every 2-4 weeks, with heavy flow, and she has significant dysmenorrhea. She also has intermenstrual spotting on occasion. She has used OCP and Mirena in the past to manage her menses. She currently desires to restart a contraceptive method that will help her cycle regulate.  Review of Systems: Negative except as stated in HPI.  Past Medical History:  Past Medical History:  Diagnosis Date  . Breast mass 2014   left breast 12 o'clcok  . Chicken pox   . Genital herpes     Past Surgical History:  Past Surgical History:  Procedure Laterality Date  . BREAST BIOPSY Left 10/11/2012   fibroadenoma removed  . BREAST SURGERY Left 2014   Biopsy - benign  . EYE SURGERY  2008   Lasix  . KNEE SURGERY Left 1992   Torn meniscus & ACL    Gynecologic History: Patient's last menstrual period was 03/15/2017 (exact date).  Obstetric History: [id number]  Family History:  Family History  Problem Relation Age of Onset  . Hypertension Mother   . Heart disease Father        CAD - Quadruple bypass  . Hypertension Father   . Diabetes Paternal Grandfather   . Cancer Maternal Grandfather 78       Colon Cancer    Social History:  Social History   Social History  . Marital status: Married    Spouse name: Annslee Tercero  . Number of children: 2  . Years of education: 40   Occupational History  . School Counselor [employer]   Social History Main Topics  . Smoking status: Former Smoker    Types: Cigarettes  . Smokeless tobacco: Never Used  . Alcohol use Yes     Comment: occasionally - maybe 1 drink/week  . Drug use: No  . Sexual activity: Yes    Birth control/ protection: None,  Other-see comments     Comment: vasectomy   Other Topics Concern  . Not on file   Social History Narrative   Emily Jensen [place] up "[place] over". Her father moved approximately every 5 years for job opportunities. She attended [employer] for her Bachelor's in Health and PE. She then attended [employer] on line for her Master's in [employer]. She works as a Animal nutritionist for [employer]. She currently lives at home with her husband, Aaron Edelman, and two sons (Aiden, Roxy Manns). They have a family dog named R.J. She enjoys the outdoors.      Caffeine - 1 daily   No smoking   Not currently exercising. Plans on starting   Diet is fairly healthy.    Allergies:  Allergies  Allergen Reactions  . Codeine Palpitations and Other (See Comments)    Medications: Prior to Admission medications   Not on File    Physical Exam Vitals:  Vitals:   03/20/17 1422  BP: 102/62  Pulse: 76   Patient's last menstrual period was 03/15/2017 (exact date).  General: NAD HEENT: normocephalic, anicteric Pulmonary: No increased work of breathing Neurologic: Grossly intact Psychiatric: mood appropriate, affect full  Assessment: 41 y.o. [id number] desires contraception that will help manage her menstrual cycle.  Plan: Problem List Items Addressed This Visit  Visit Diagnoses    Encounter for other general counseling or advice on contraception    -  Primary     Discussed contraceptive options available to help with menorrhagia and dysmenorrhea, including Mirena, Nexplanon, and OCPs. Patient is currently interested in [place] IUD. She needs to clarify that her insurance will fully cover the device. She will schedule an appointment for insertion once this is confirmed.   A total of 15 minutes were spent in face-to-face contact with the patient during this encounter with over half of that time devoted to counseling and coordination of care.  Avel Sensor, CNM 03/20/2017  2:51 PM

## 2017-03-31 NOTE — Telephone Encounter (Signed)
Noted. Will order to arrive by apt date/time. 

## 2017-04-11 NOTE — Telephone Encounter (Signed)
Pt is actively on menses and wants to be seen for mirena insertion 04/12/17. Pt is schedule with CLG 04/19/17 with CLG for annual

## 2017-04-12 ENCOUNTER — Encounter: Payer: Self-pay | Admitting: Obstetrics and Gynecology

## 2017-04-12 ENCOUNTER — Ambulatory Visit (INDEPENDENT_AMBULATORY_CARE_PROVIDER_SITE_OTHER): Payer: BC Managed Care – PPO | Admitting: Obstetrics and Gynecology

## 2017-04-12 VITALS — BP 110/70 | HR 66 | Ht 68.0 in | Wt 179.0 lb

## 2017-04-12 DIAGNOSIS — Z30430 Encounter for insertion of intrauterine contraceptive device: Secondary | ICD-10-CM | POA: Diagnosis not present

## 2017-04-12 MED ORDER — LEVONORGESTREL 20 MCG/24HR IU IUD: 1.0000 | INTRAUTERINE_SYSTEM | Freq: Once | INTRAUTERINE | 0 refills | Status: DC

## 2017-04-12 NOTE — Patient Instructions (Signed)

## 2017-04-12 NOTE — Progress Notes (Signed)
   Chief Complaint  Patient presents with  . Contraception     IUD PROCEDURE NOTE:  MAUDELL STANBROUGH is a 41 y.o. [phone] here for Mirena  IUD insertion for menorrhagia/DUB sx. Neg eval with Dr. Glennon Mac. Had IUD in past with sx relief of sx.  Last pap smear was normal.  BP 110/70   Pulse 66   Ht 5\' 8"  (1.727 m)   Wt 179 lb (81.2 kg)   LMP 03/15/2017 (Exact Date)   BMI 27.22 kg/m   IUD Insertion Procedure Note Patient identified, informed consent performed, consent signed.   Discussed risks of irregular bleeding, cramping, infection, malpositioning or misplacement of the IUD outside the uterus which may require further procedure such as laparoscopy, risk of failure <1%. Time out was performed.    A bimanual exam showed the uterus to be anteverted.  Speculum placed in the vagina.  Cervix visualized.  Cleaned with Betadine x 2.  Grasped anteriorly with a single tooth tenaculum.  Uterus sounded to 8.0 cm.   IUD placed per manufacturer's recommendations.  Strings trimmed to 3 cm. Tenaculum was removed, good hemostasis noted.  Patient tolerated procedure well.   ASSESSMENT:  Encounter for insertion of intrauterine contraceptive device (IUD) - Plan: levonorgestrel (MIRENA, 52 MG,) 20 MCG/24HR IUD   Meds ordered this encounter  Medications  . levonorgestrel (MIRENA, 52 MG,) 20 MCG/24HR IUD    Sig: 1 Intra Uterine Device (1 each total) by Intrauterine route once.    Dispense:  1 Intra Uterine Device    Refill:  0     Plan:  Patient was given post-procedure instructions.  She was advised to have backup contraception for one week.   Call if you are having increasing pain, cramps or bleeding or if you have a fever greater than 100.4 degrees F., shaking chills, nausea or vomiting. Patient was also asked to check IUD strings periodically and follow up in 4 weeks for IUD check.  Return in about 4 weeks (around 05/10/2017) for IUD check.  Elianna Windom B. Tobi Groesbeck, PA-C 04/12/2017 9:44  AM

## 2017-04-19 ENCOUNTER — Encounter: Payer: Self-pay | Admitting: Certified Nurse Midwife

## 2017-04-19 ENCOUNTER — Ambulatory Visit: Payer: BC Managed Care – PPO | Admitting: Certified Nurse Midwife

## 2017-04-19 ENCOUNTER — Ambulatory Visit (INDEPENDENT_AMBULATORY_CARE_PROVIDER_SITE_OTHER): Payer: BC Managed Care – PPO | Admitting: Certified Nurse Midwife

## 2017-04-19 VITALS — BP 110/70 | HR 82 | Ht 68.0 in | Wt 179.0 lb

## 2017-04-19 DIAGNOSIS — Z8619 Personal history of other infectious and parasitic diseases: Secondary | ICD-10-CM

## 2017-04-19 DIAGNOSIS — N921 Excessive and frequent menstruation with irregular cycle: Secondary | ICD-10-CM

## 2017-04-19 DIAGNOSIS — Z01419 Encounter for gynecological examination (general) (routine) without abnormal findings: Secondary | ICD-10-CM | POA: Diagnosis not present

## 2017-04-19 DIAGNOSIS — N393 Stress incontinence (female) (male): Secondary | ICD-10-CM

## 2017-04-19 NOTE — Progress Notes (Signed)
Gynecology Annual Exam  PCP: Leone Haven, MD  Chief Complaint:  Chief Complaint  Patient presents with  . Gynecologic Exam    History of Present Illness:Emily Jensen is a 41 year old Caucasian/White female, [place], who presents for her annual gyn exam. She had a Mirena IUD inserted last week for treatment of her menometrorrhagia. She had a negative sonohystogram last year by Dr Glennon Mac. Her menses have been q2-4 weeks, lasting 7 days and 2-3 days are are heavy with clots. She also has had IMB/ PCS.  She has bled most of October.  She reports dysmenorrhea/rectal pressure with menses. She uses Advil occasionally with relief.  Since her last annual GYN exam dated 01/01/2016, she complains of urinary incontinence when she laughs, coughs, runs, etc. She drinks 75 oz of water during the day, no coffee, some hpt tea in the AM, and no soda. She denies sx of a UTI. She denies history of stones and recurrent UTIs in the past. Recently bought some Kegel vaginal balls to help her do Kegel exercises.  She has lost 12# since her last visit She is sexually active. She is currently using a vasectomy for contraception.  Her most recent pap smear was obtained 11/27/2012 and was NIL/negative HRHPV  Her most recent mammogram obtained on 09/20/2012 revealed a mass in her left breast which was later excised and was benign.  There is no family history of breast cancer.  There is no family history of ovarian cancer.  The patient does do occasional self breast exams.  The patient does not smoke. Is a former smoker The patient does drink 2 glasses of wine a week.  The patient does not use illegal drugs.  The patient exercises regularly.  The patient does not get adequate calcium in her diet.  She has  had a recent cholesterol screen in 2017 and it was normal.     The patient denies current symptoms of depression.    Review of Systems: Review of Systems  Constitutional: Positive for weight  loss. Negative for chills and fever.  HENT: Negative for congestion, sinus pain and sore throat.   Eyes: Negative for blurred vision and pain.  Respiratory: Negative for hemoptysis, shortness of breath and wheezing.   Cardiovascular: Negative for chest pain, palpitations and leg swelling.  Gastrointestinal: Negative for abdominal pain, blood in stool, diarrhea, heartburn, nausea and vomiting.  Genitourinary: Negative for dysuria, frequency, hematuria and urgency.       Positive for incontinence  Musculoskeletal: Negative for back pain, joint pain and myalgias.  Skin: Negative for itching and rash.  Neurological: Negative for dizziness, tingling and headaches.  Endo/Heme/Allergies: Negative for environmental allergies and polydipsia. Does not bruise/bleed easily.       Negative for hirsutism   Psychiatric/Behavioral: Negative for depression. The patient is not nervous/anxious and does not have insomnia.     Past Medical History:  Past Medical History:  Diagnosis Date  . Breast mass 2014   left breast 12 o'clcok  . Chicken pox   . Genital herpes   . Menometrorrhagia     Past Surgical History:  Past Surgical History:  Procedure Laterality Date  . BREAST BIOPSY Left 10/11/2012   fibroadenoma removed  . EYE SURGERY  2008   Lasix  . KNEE SURGERY Left 1992   arthroscopy for Torn meniscus & ACL  . MOUTH SURGERY  2000   gum graft  . [place] EXTRACTION  2000  Family History:  Family History  Problem Relation Age of Onset  . Hypertension Mother   . Heart disease Father 52       CAD - Quadruple bypass/ MI x 2  . Hypertension Father   . Diabetes Paternal Grandfather   . Cancer Maternal Grandfather 16       Colon Cancer    Social History:  Social History   Social History  . Marital status: Married    Spouse name: Agnieszka Newhouse  . Number of children: 2  . Years of education: 50   Occupational History  . School Counselor [employer]   Social History Main Topics  . Smoking status: Former Smoker    Types: Cigarettes  . Smokeless tobacco: Never Used  . Alcohol use Yes     Comment: occasionally - maybe 1 drink/week  . Drug use: No  . Sexual activity: Yes    Birth control/ protection: Other-see comments, IUD     Comment: vasectomy   Other Topics Concern  . Not on file   Social History Narrative   Emily Jensen [place] up "[place] over". Her father moved approximately every 5 years for job opportunities. She attended [employer] for her Bachelor's in Health and PE. She then attended [employer] on line for her Master's in [employer]. She works as a Animal nutritionist for [employer]. She currently lives at home with her husband, Aaron Edelman, and two sons (Aiden, Roxy Manns). They have a family dog named R.J. She enjoys the outdoors.      Caffeine - 1 daily   No smoking   Not currently exercising. Plans on starting   Diet is fairly healthy.    Allergies:  Allergies  Allergen Reactions  . Codeine Palpitations and Other (See Comments)    Medications: Prior to Admission medications   Medication Sig Start Date End Date Taking? Authorizing Provider  levonorgestrel (MIRENA, 52 MG,) 20 MCG/24HR IUD 1 Intra Uterine Device (1 each total) by Intrauterine route once. 04/12/17 83/41/96  Copland, Deirdre Evener, PA-C  Occasional iron, fiber, vitamin C and vitamin B complex  Physical Exam Vitals: BP 110/70   Pulse 82   Ht 5\' 8"  (1.727 m)   Wt 179 lb (81.2 kg)   LMP 04/11/2017 (Exact Date)   BMI 27.22 kg/m   General: WF in NAD HEENT: normocephalic, anicteric Neck: no thyroid enlargement, no palpable nodules, no cervical lymphadenopathy  Pulmonary: No increased work of breathing, CTAB Cardiovascular: RRR, without murmur  Breast: Breast symmetrical, no tenderness, no palpable nodules or masses, no skin changes, no nipple discharge.  Left nipple mildly inverted ( no changes). No axillary, infraclavicular or  supraclavicular lymphadenopathy. Abdomen: Soft, non-tender, non-distended.  Umbilicus without lesions.  No hepatomegaly or masses palpable. No evidence of hernia. Genitourinary:  External: Normal external female genitalia.  Normal urethral meatus, normal Bartholin's and Skene's glands.    Vagina: Normal vaginal mucosa, +mild rectocele with Valsalva and ?urethrocele  Cervix: Grossly normal in appearance, no bleeding, non-tender, IUD strings present  Uterus: Retroflexed, normal size, shape, and consistency, mobile, and non-tender  Adnexa: No adnexal masses, non-tender  Rectal: deferred  Lymphatic: no evidence of inguinal lymphadenopathy Extremities: no edema, erythema, or tenderness Neurologic: Grossly intact Psychiatric: mood appropriate, affect full     Assessment: 41 y.o. [id number] annual gyn exam Stress urinary incontinence  Explained how to do Kegels  If not seeing improvement after 2 months of daily Kegels, can refer to PT  for pelvic floor training or send to urogynecology Menometrorrhagia with recent IUD insertion-IUD in place Plan:   1) Breast cancer screening - recommend monthly self breast exam.Encouraged annual mammograms at age 84.  2) Patient desires testing for HSV. Had one outbreak a long time ago, none since. HSV I and II IGG,IGM ordered  3) Cervical cancer screening - Pap not indicated. ASCCP guidelines and rational discussed.  Patient opts for every 3 years screening interval  4) Contraception - Vasectomy  5) Routine healthcare maintenance including cholesterol and diabetes screening UTD   6) RTO 1 year and prn.  Dalia Heading, CNM

## 2017-04-20 ENCOUNTER — Encounter: Payer: Self-pay | Admitting: Certified Nurse Midwife

## 2017-04-21 LAB — HSV(HERPES SMPLX)ABS-I+II(IGG+IGM)-BLD
HSV 1 Glycoprotein G Ab, IgG: 11.7 index — ABNORMAL HIGH (ref 0.00–0.90)
HSV 2 IgG, Type Spec: <0.91 index (ref 0.00–0.90)
HSVI/II Comb IgM: 2.02 Ratio — ABNORMAL HIGH (ref 0.00–0.90)

## 2017-04-25 NOTE — Telephone Encounter (Signed)
Mirena stock provided for this patient on 04/12/17

## 2017-05-02 ENCOUNTER — Telehealth: Payer: Self-pay

## 2017-05-02 NOTE — Telephone Encounter (Signed)
Pt called triage line stating she had the IUD placed by ABC about 1 month ago. She is having bleeding, not bad but still there and she wants to know the reasoning behind it. CB# [phone]    Pt advised having bleeding/spotting at first is normal d/t the body sees it as "foreign object" plus it is continuous hormones, both of these are new to the body and bleeding is only way body can react. Advised to wait it out a little longer. She voiced an understanding. KJ CMA

## 2017-05-08 ENCOUNTER — Telehealth: Payer: Self-pay

## 2017-05-08 NOTE — Telephone Encounter (Signed)
Pt had mirena IUD inserted about a month ago.  Called last we re spotting.  Over the weekend it became like a full blown period.  Is this normal?  [phone]  Adv it is probably her period, no guarantee periods will stop, it takes a good 80m for body to adjust to new bc.  Pt states she doesn't have IUD for bc but for period control.  Adv to wait at least 2 more months to let body adjust. As long as she isn't saturating a pad q65min to 1hr she's okay.  Pt states she isn't bleeding that heavy.

## 2017-05-10 ENCOUNTER — Ambulatory Visit: Payer: BC Managed Care – PPO | Admitting: Obstetrics and Gynecology

## 2017-05-30 ENCOUNTER — Telehealth: Payer: Self-pay

## 2017-05-30 NOTE — Telephone Encounter (Signed)
Pt aware abnormal bleeding can occur for first 3 months. Encouraged pt to check for strings to reassure placement and reviewed post insertion instructions. Pt voiced understanding and appreciative.

## 2017-05-30 NOTE — Telephone Encounter (Signed)
Pt had mirena placed ~1 mo ago. She had a period & stopped for 2 weeks & now is having a period again. She was told to give it 3 mos. Just inquiring that this is normal & also if rough intercourse could knock it loose & cause bleeding. [phone]

## 2017-07-13 ENCOUNTER — Telehealth: Payer: Self-pay

## 2017-07-13 NOTE — Telephone Encounter (Signed)
Pt had mirena inserted in October and states she hasn't stopped bleeding. She is still having periods every 2 weeks like before and wants to know if she needs to have IUD removed. Pt is frustrated with ongoing bleeding. Please advise. Cb# [phone] thank you.

## 2017-07-13 NOTE — Telephone Encounter (Signed)
Irregular bleeding can be 3-6 months with IUD. Check IUD placement with u/s. ABC to call pt after u/s. If in place and bleeding persists to 6 months, RTO with SDJ to discuss other mgmt options.

## 2017-07-13 NOTE — Telephone Encounter (Signed)
Pt aware and transferred to front desk for scheduling for u/s.

## 2017-07-18 ENCOUNTER — Ambulatory Visit (INDEPENDENT_AMBULATORY_CARE_PROVIDER_SITE_OTHER): Payer: BC Managed Care – PPO

## 2017-07-18 ENCOUNTER — Other Ambulatory Visit: Payer: Self-pay | Admitting: Obstetrics and Gynecology

## 2017-07-18 ENCOUNTER — Other Ambulatory Visit: Payer: BC Managed Care – PPO

## 2017-07-18 DIAGNOSIS — T839XXA Unspecified complication of genitourinary prosthetic device, implant and graft, initial encounter: Secondary | ICD-10-CM

## 2017-07-20 ENCOUNTER — Telehealth: Payer: Self-pay | Admitting: Obstetrics and Gynecology

## 2017-07-20 NOTE — Telephone Encounter (Signed)
Pt with hx of irreg bleeding on OCPs. Had Mirena placed 10/18 for sx. Still having irregular bleeding, lasting 2 wks, and frequent spotting. U/S 07/18/17 confirmed IUD in correct place and pt has 2 small leio. Pt FRUSTRATED with cont bleeding. Has done OCPs in the past and actually took a few days worth of leftover OCPs and had some temporary bleeding relief. Not interested in hyst if possible.  Pt to call me with OCP info and I will send in Rx. Do cont dosing with active pills only to see if can get bleeding control. F/u prn sx.  If sx persist, refer back to Dr. Glennon Mac.  ULTRASOUND REPORT  Location: [facility]  Date of Service: 07/18/2017    Indications:AUB with IUD Findings:  The uterus measures 9.71 x 6.08 x 5.22cm. Echo texture is heterogenous with evidence of focal masses. Within the uterus are multiple suspected fibroids measuring: Fibroid 1: .58 x .69cm (Ant. Body); intramural with possible submucousal extension  Fibroid 2: 2.32 x 2.30cm (Ant. Fundal); intramural  The Endometrium measures 5.07 mm. IUD appears to be in the correct location in the fundus of the uterus.  Right Ovary measures 3.52 x 1.67 x 2.28 cm. It is normal in appearance. Left Ovary measures 4.99 x 4.13 x 3.13 cm. It contains a dominant follicle vs simple cyst  There is no free fluid in the cul de sac.  Impression: 1. Multiple fibroids 2. IUD appears to be in the correct location 3. Possible left ovarian cyst  Recommendations: 1.Clinical correlation with the patient's History and Physical Exam.   Edwena Bunde, RDMS, RVT

## 2017-07-21 ENCOUNTER — Telehealth: Payer: Self-pay

## 2017-07-21 NOTE — Telephone Encounter (Signed)
Pt called triage to request rx of previous birth control she had taken which is tri-sprintec. Please send to [facility] [address]. Pt aware [facility] out of the office today. Cb# [phone] thank you!

## 2017-07-22 ENCOUNTER — Other Ambulatory Visit: Payer: Self-pay | Admitting: Obstetrics and Gynecology

## 2017-07-22 MED ORDER — NORGESTIMATE-ETH ESTRADIOL 0.25-35 MG-MCG PO TABS: 1.0000 | ORAL_TABLET | Freq: Every day | ORAL | 2 refills | Status: DC

## 2017-07-22 NOTE — Progress Notes (Unsigned)
Rx sprintec cont dosing for DUB with neg eval. Still bleeding with mirena.

## 2017-07-22 NOTE — Telephone Encounter (Signed)
RN to let pt know sprintec eRxd (very similar to tri-sprintec) because this way she can do cont dosing that we discussed. F/u prn.

## 2017-07-24 NOTE — Telephone Encounter (Signed)
Pt aware.

## 2017-08-28 ENCOUNTER — Telehealth: Payer: Self-pay

## 2017-08-28 NOTE — Telephone Encounter (Signed)
Pt states she had intercourse Friday & Saturday. Sunday she started having a period. Today it is lighter (just brown), but is inquiring about this since she does have Mirena & is on OCP. [phone].

## 2017-08-28 NOTE — Telephone Encounter (Signed)
Hx of irreg menses before IUD and since. Irreg bleeding can happen first 6 months (placed 10/18). If sx persist, pt needs to f/u with Dr. Glennon Mac for mgmt options. Had neg u/s 1/19--IUD in place.

## 2017-08-29 NOTE — Telephone Encounter (Signed)
Spoke to pt and advised that ultrasound in January was normal and IUD in correct place. She has recently started 2nd pack of OCP with continuous dosing and may be having BTB secondary to intercourse. Per pt she is having bright red bleeding like a normal period. Also advised pt that she has multiple fibroids which may be contributing to bleeding and recommended appt with SDJ for mgmt per [facility] last note. Pt very frustrated and reluctant to come in but due to hx and unsatisfied with current treatment that consult with MD would be next steps. Transferred pt to front desk for scheduling.

## 2017-08-29 NOTE — Telephone Encounter (Signed)
Pt states she had intercourse and started bleeding, spotting, dark blood. She states she is taking BC pills and has IUD. Doesn't understand why she started bleeding after intercourse, and doesn't want to keep coming to the OBGYN to get poked and prodded. She just wants answers. I advised her to make appointment, she just wants a call.

## 2017-08-29 NOTE — Telephone Encounter (Signed)
Pt requesting to see/speak with CG since she has seen her a lot in the past. fwding to [facility]

## 2017-09-04 ENCOUNTER — Encounter: Payer: Self-pay | Admitting: Obstetrics and Gynecology

## 2017-09-04 ENCOUNTER — Ambulatory Visit (INDEPENDENT_AMBULATORY_CARE_PROVIDER_SITE_OTHER): Payer: BC Managed Care – PPO | Admitting: Obstetrics and Gynecology

## 2017-09-04 VITALS — BP 128/84 | Ht 68.0 in | Wt 185.0 lb

## 2017-09-04 DIAGNOSIS — N930 Postcoital and contact bleeding: Secondary | ICD-10-CM

## 2017-09-04 DIAGNOSIS — N921 Excessive and frequent menstruation with irregular cycle: Secondary | ICD-10-CM | POA: Diagnosis not present

## 2017-09-04 NOTE — Progress Notes (Signed)
Obstetrics & Gynecology Office Visit   Chief Complaint  Patient presents with  . Menstrual Problem   History of Present Illness: 42 y.o. G8P2002 female who continues to have bleeding issues. She had a Mirena IUD placed in 03/2017.  She later added OCPs to help control bleeding.  Her bleeding is much improved, though inconsistent even with combined OCPs.  She is concerned because she had a massive period after intercourse last Sunday and spotted through Wednesday. She is quite frustrated with these symptoms, as it seems she has tried several things and does not want to have to worry about bleeding after intercourse.  She would like to discuss options regarding what she would do, if she had the Mirena removed.  Last pap smear in 2014 and was reportedly normal. This was a cytology pap smear only and did not test for HPV.   Past Medical History:  Diagnosis Date  . Breast mass 2014   left breast 12 o'clcok  . Chicken pox   . Genital herpes   . Menometrorrhagia     Past Surgical History:  Procedure Laterality Date  . BREAST BIOPSY Left 10/11/2012   fibroadenoma removed  . EYE SURGERY  2008   Lasix  . KNEE SURGERY Left 1992   arthroscopy for Torn meniscus & ACL  . MOUTH SURGERY  2000   gum graft  . WISDOM TOOTH EXTRACTION  2000    Gynecologic History: No LMP recorded.  Obstetric History: [id number]  Family History  Problem Relation Age of Onset  . Hypertension Mother   . Heart disease Father 74       CAD - Quadruple bypass/ MI x 2  . Hypertension Father   . Diabetes Paternal Grandfather   . Cancer Maternal Grandfather 30       Colon Cancer    Social History   Socioeconomic History  . Marital status: Married    Spouse name: Cyndy Braver  . Number of children: 2  . Years of education: 60  . Highest education level: Not on file  Social Needs  . Financial resource strain: Not on file  . Food insecurity - worry: Not on file  . Food insecurity - inability: Not on file  .  Transportation needs - medical: Not on file  . Transportation needs - non-medical: Not on file  Occupational History  . Occupation: Fish farm manager: [employer]     Comment: Quarry manager  Tobacco Use  . Smoking status: Former Smoker    Types: Cigarettes  . Smokeless tobacco: Never Used  Substance and Sexual Activity  . Alcohol use: Yes    Comment: occasionally - maybe 1 drink/week  . Drug use: No  . Sexual activity: Yes    Birth control/protection: Other-see comments, IUD    Comment: vasectomy  Other Topics Concern  . Not on file  Social History Narrative   Javia [place] up "[place] over". Her father moved approximately every 5 years for job opportunities. She attended [employer] for her Bachelor's in Health and PE. She then attended [employer] on line for her Master's in [employer]. She works as a Animal nutritionist for [employer]. She currently lives at home with her husband, Aaron Edelman, and two sons (Aiden, Roxy Manns). They have a family dog named R.J. She enjoys the outdoors.      Caffeine - 1 daily   No smoking   Not currently exercising. Plans on starting   Diet is fairly healthy.  Allergies  Allergen Reactions  . Codeine Palpitations and Other (See Comments)    Mn medications   Medication Sig Start Date End Date Taking? Authorizing Provider  norgestimate-ethinyl estradiol (ORTHO-CYCLEN,SPRINTEC,PREVIFEM) 0.25-35 MG-MCG tablet Take 1 tablet by mouth daily. CONTINUOUS DOSING 1/61/09  Yes Copland, Deirdre Evener, PA-C  levonorgestrel (MIRENA, 52 MG,) 20 MCG/24HR IUD 1 Intra Uterine Device (1 each total) by Intrauterine route once. 04/12/17 60/45/40  Copland, Deirdre Evener, PA-C    Review of Systems  Constitutional: Negative.   HENT: Negative.   Eyes: Negative.   Respiratory: Negative.   Cardiovascular: Negative.   Gastrointestinal: Negative.   Genitourinary: Negative.        See HPI  Musculoskeletal: Negative.   Skin: Negative.     Neurological: Negative.   Psychiatric/Behavioral: Negative.      Physical Exam BP 128/84   Ht 5\' 8"  (1.727 m)   Wt 185 lb (83.9 kg)   BMI 28.13 kg/m  No LMP recorded. Physical Exam  Constitutional: She is oriented to person, place, and time. She appears well-developed and well-nourished. No distress.  HENT:  Head: Normocephalic and atraumatic.  Eyes: Conjunctivae are normal. No scleral icterus.  Pulmonary/Chest: Effort normal. No respiratory distress.  Musculoskeletal: Normal range of motion. She exhibits no edema.  Neurological: She is alert and oriented to person, place, and time. No cranial nerve deficit.  Psychiatric: She has a normal mood and affect. Her behavior is normal. Judgment normal.   Assessment: 42 y.o. [id number] female here for  1. Menorrhagia with irregular cycle   2. Postcoital bleeding      Plan: Problem List Items Addressed This Visit    None    Visit Diagnoses    Menorrhagia with irregular cycle    -  Primary   Postcoital bleeding        Patient is clearly frustrated with current situation.  She has tried multiple modalities of controlling her bleeding.  We discussed the options of no treatment, trying different medication treatment, and surgery with either endometrial ablation or hysterectomy.  She is not ready for a surgical option at this time according to her.  She would like to think about whether to have the IUD removed and what her options might be in that situation.  She further states that she is concerned about taking hormones and the effect and risks they may have on her body.  She would like to think about it and let me know.   15 minutes spent in face to face discussion with > 50% spent in counseling,management, and coordination of care of her menorrhagia with irregular cycle and postcoital bleeding.   Prentice Docker, MD 09/04/2017 10:22 PM

## 2017-09-05 ENCOUNTER — Telehealth: Payer: Self-pay

## 2017-09-05 NOTE — Telephone Encounter (Signed)
Spoke with patient.  She would like to try a device that limits the depth of penetration during intercourse.  If that does not work we discussed removing the IUD and trying low Loestrin.  If that does not work then she will consider an endometrial ablation.  All questions answered.

## 2017-09-05 NOTE — Telephone Encounter (Signed)
Pt reports she is going to keep her IUD for now. She & her husband are going to try a different avenue and if that doesn't work, then she will schedule an apt to have her IUD removed. Just FYI, she is having a full blown period today.

## 2017-09-15 ENCOUNTER — Ambulatory Visit (INDEPENDENT_AMBULATORY_CARE_PROVIDER_SITE_OTHER): Payer: BC Managed Care – PPO | Admitting: Obstetrics and Gynecology

## 2017-09-15 ENCOUNTER — Encounter: Payer: Self-pay | Admitting: Obstetrics and Gynecology

## 2017-09-15 VITALS — BP 118/74 | Wt 184.0 lb

## 2017-09-15 DIAGNOSIS — Z30432 Encounter for removal of intrauterine contraceptive device: Secondary | ICD-10-CM

## 2017-09-15 DIAGNOSIS — N926 Irregular menstruation, unspecified: Secondary | ICD-10-CM

## 2017-09-15 MED ORDER — NORETHIN-ETH ESTRAD-FE BIPHAS 1 MG-10 MCG / 10 MCG PO TABS: 1.0000 | ORAL_TABLET | Freq: Every day | ORAL | 0 refills | Status: DC

## 2017-09-15 NOTE — Progress Notes (Signed)
   IUD Removal  Patient identified, informed consent performed, consent signed.  Patient was in the dorsal lithotomy position, normal external genitalia was noted.  A speculum was placed in the patient's vagina, normal discharge was noted, no lesions. The cervix was visualized, no lesions, no abnormal discharge.  The strings of the IUD were grasped and pulled using ring forceps. The IUD was removed in its entirety. Patient tolerated the procedure well.    Samples for lo loestrin provided x 3 cycles. If satisfied, will provide long-term rx.  Prentice Docker, MD, [facility], [facility] 09/15/2017 11:33 AM

## 2017-11-09 ENCOUNTER — Telehealth: Payer: Self-pay

## 2017-11-09 DIAGNOSIS — N926 Irregular menstruation, unspecified: Secondary | ICD-10-CM

## 2017-11-09 MED ORDER — NORETHIN-ETH ESTRAD-FE BIPHAS 1 MG-10 MCG / 10 MCG PO TABS: 1.0000 | ORAL_TABLET | Freq: Every day | ORAL | 0 refills | Status: DC

## 2017-11-09 NOTE — Telephone Encounter (Signed)
Pt calling triage, never went to pick up RX that was sent to pharm at last visit, only took the samples given to her. Needing a pack sent to pharm. Will call soon to schedule her f/u appt with SDJ. Sending in 1 pack to last to f/u appt. Pt appreciative

## 2017-11-10 ENCOUNTER — Other Ambulatory Visit: Payer: Self-pay

## 2017-11-10 DIAGNOSIS — N926 Irregular menstruation, unspecified: Secondary | ICD-10-CM

## 2017-11-10 MED ORDER — NORETHIN-ETH ESTRAD-FE BIPHAS 1 MG-10 MCG / 10 MCG PO TABS
1.0000 | ORAL_TABLET | Freq: Every day | ORAL | 0 refills | Status: DC
Start: 2017-11-10 — End: 2018-05-08

## 2017-11-10 NOTE — Telephone Encounter (Signed)
Resent RX. Pt states it was not at [facility]

## 2017-11-14 NOTE — Telephone Encounter (Signed)
Pt states that pharm still did not have RX. I just called pharm and verbally filled RX for pt. She is aware it should be ready within 30 mins. Pt appreciative

## 2017-12-13 ENCOUNTER — Encounter: Payer: Self-pay | Admitting: Obstetrics and Gynecology

## 2017-12-13 ENCOUNTER — Ambulatory Visit (INDEPENDENT_AMBULATORY_CARE_PROVIDER_SITE_OTHER): Payer: BC Managed Care – PPO | Admitting: Obstetrics and Gynecology

## 2017-12-13 ENCOUNTER — Encounter (INDEPENDENT_AMBULATORY_CARE_PROVIDER_SITE_OTHER): Payer: Self-pay

## 2017-12-13 VITALS — BP 108/64 | HR 100 | Ht 68.0 in | Wt 193.0 lb

## 2017-12-13 DIAGNOSIS — N921 Excessive and frequent menstruation with irregular cycle: Secondary | ICD-10-CM | POA: Diagnosis not present

## 2017-12-13 DIAGNOSIS — D251 Intramural leiomyoma of uterus: Secondary | ICD-10-CM | POA: Diagnosis not present

## 2017-12-13 DIAGNOSIS — D259 Leiomyoma of uterus, unspecified: Secondary | ICD-10-CM | POA: Insufficient documentation

## 2017-12-13 NOTE — Progress Notes (Signed)
Obstetrics & Gynecology Office Visit    Chief Complaint  Patient presents with  . Follow-up    Birth control, not working, bleeding   History of Present Illness: 42 y.o. G25P2002 female who presents for abnormal uterine bleeding (menorrhagia with irregular cycle). She has tried and failed multiple forms of hormonal contraception, including combined OCPs and a Mirena IUD.  Most recently she has failed lo loestrin.  She had a sonohysterogram two years ago that showed no defects in the cavity of her uterus. More recently she had an ultrasound that showed a couple of small fibroids, one noted as possibly submucosal.  Last pap smear 12/2015, NILM, HPV negative.  She has never had endometrial sampling.    She presents today with continued and worsening irregular menstrual bleeding.  She brings a calendar in that shows days of spotting, heavy bleeding, cramping, that is not cyclic.  She now desires definitive management.   Past Medical History:  Diagnosis Date  . Breast mass 2014   left breast 12 o'clcok  . Chicken pox   . Genital herpes   . Menometrorrhagia     Past Surgical History:  Procedure Laterality Date  . BREAST BIOPSY Left 10/11/2012   fibroadenoma removed  . EYE SURGERY  2008   Lasix  . KNEE SURGERY Left 1992   arthroscopy for Torn meniscus & ACL  . MOUTH SURGERY  2000   gum graft  . WISDOM TOOTH EXTRACTION  2000    Gynecologic History: No LMP recorded. (Menstrual status: IUD).  Obstetric History: [id number], s/p SVD x 2.  Family History  Problem Relation Age of Onset  . Hypertension Mother   . Heart disease Father 40       CAD - Quadruple bypass/ MI x 2  . Hypertension Father   . Diabetes Paternal Grandfather   . Cancer Maternal Grandfather 40       Colon Cancer    Social History   Socioeconomic History  . Marital status: Married    Spouse name: Lasean Rahming  . Number of children: 2  . Years of education: 49  . Highest education level: Not on file    Occupational History  . Occupation: Fish farm manager: [employer]     Comment: Quarry manager  Social Needs  . Financial resource strain: Not on file  . Food insecurity:    Worry: Not on file    Inability: Not on file  . Transportation needs:    Medical: Not on file    Non-medical: Not on file  Tobacco Use  . Smoking status: Former Smoker    Types: Cigarettes  . Smokeless tobacco: Never Used  Substance and Sexual Activity  . Alcohol use: Yes    Comment: occasionally - maybe 1 drink/week  . Drug use: No  . Sexual activity: Yes    Birth control/protection: Other-see comments, IUD    Comment: vasectomy  Lifestyle  . Physical activity:    Days per week: Not on file    Minutes per session: Not on file  . Stress: Not on file  Relationships  . Social connections:    Talks on phone: Not on file    Gets together: Not on file    Attends religious service: Not on file    Active member of club or organization: Not on file    Attends meetings of clubs or organizations: Not on file    Relationship status: Not on file  . Intimate partner  violence:    Fear of current or ex partner: Not on file    Emotionally abused: Not on file    Physically abused: Not on file    Forced sexual activity: Not on file  Other Topics Concern  . Not on file  Social History Narrative   Julianah [place] "[place] over[place] Her father moved approximately every 5 years for job opportunities. She attended [employer] for her Bachelor's in Health and PE. She then attended [employer] on line for her Master's in [employer]. She works as a Animal nutritionist for [employer]. She currently lives at home with her husband, Aaron Edelman, and two sons (Aiden, Roxy Manns). They have a family dog named R.J. She enjoys the outdoors.      Caffeine - 1 daily   No smoking   Not currently exercising. Plans on starting   Diet is fairly healthy.    Allergies  Allergen Reactions  . Codeine  Palpitations and Other (See Comments)    Prior to Admission medications   Medication Sig Start Date End Date Taking? Authorizing Provider  Norethindrone-Ethinyl Estradiol-Fe Biphas (LO LOESTRIN FE) 1 MG-10 MCG / 10 MCG tablet Take 1 tablet by mouth daily. 11/10/17 02/02/18 Yes Will Bonnet, MD    Review of Systems  Constitutional: Negative.   HENT: Negative.   Eyes: Negative.   Respiratory: Negative.   Cardiovascular: Negative.   Gastrointestinal: Negative.   Genitourinary: Negative.        See HPI  Musculoskeletal: Negative.   Skin: Negative.   Neurological: Negative.   Psychiatric/Behavioral: Negative.      Physical Exam BP 108/64 (BP Location: Left Arm, Patient Position: Sitting, Cuff Size: Normal)   Pulse 100   Ht 5\' 8"  (1.727 m)   Wt 193 lb (87.5 kg)   SpO2 99%   BMI 29.35 kg/m  No LMP recorded. (Menstrual status: IUD). Physical Exam  Constitutional: She is oriented to person, place, and time. She appears well-developed and well-nourished. No distress.  HENT:  Head: Normocephalic and atraumatic.  Eyes: Conjunctivae are normal. No scleral icterus.  Neurological: She is alert and oriented to person, place, and time. No cranial nerve deficit.  Skin: Skin is warm and dry. No erythema.  Psychiatric: She has a normal mood and affect. Her behavior is normal. Judgment normal.    Assessment: 42 y.o. [id number] female here for  1. Menorrhagia with irregular cycle   2. Intramural leiomyoma of uterus      Plan: Problem List Items Addressed This Visit      Genitourinary   Fibroid uterus     Other   Menorrhagia with irregular cycle - Primary     Discussed options for ongoing management.  She presents wanting a hysterectomy. Discussed alternatives briefly, including less invasive surgery with ablation.  She would like to proceed with hysterectomy. Will schedule.   15 minutes spent in face to face discussion with > 50% spent in counseling,management, and coordination of  care of her menorrhagia with irregular cycle and fibroid uterus.   Prentice Docker, MD 12/13/2017 12:10 PM

## 2017-12-14 ENCOUNTER — Telehealth: Payer: Self-pay | Admitting: Obstetrics and Gynecology

## 2017-12-14 NOTE — Telephone Encounter (Signed)
Per Dr. Glennon Mac, the endometrial biopsy should not wait until November. Patient is aware of appointment w/ Dr. Glennon Mac on Monday, 01/08/18 @ 8:10am.

## 2017-12-14 NOTE — Telephone Encounter (Signed)
Patient requested the week of Thanksgiving. Patient is aware of H&P/ END BX at [facility] on 05/09/18 @ 8:50am w/ Dr. Glennon Mac, Pre-admit Testing afterwards, and OR on 05/22/18. Patient to read Dr. Marisue Brooklyn message regarding the endometrial biopsy on MyChart. Patient is aware even though she is the first case scheduled for this date, that the OR schedules by the type of case, and OR  times will change. Patient is aware she will receive calls from the [facility] and [facility]. Patient confirmed BCBS, and no secondary insurance. Ext given.

## 2017-12-14 NOTE — Telephone Encounter (Signed)
-----   Message from Will Bonnet, MD sent at 12/13/2017 12:11 PM EDT ----- Regarding: Schedule surgery Surgery Booking Request Patient Full Name:  Emily Jensen  MRN: [id number]  DOB: 1976/06/10  Surgeon: Prentice Docker, MD  Requested Surgery Date and Time: TBD per patient Primary Diagnosis AND Code: menorrhagia irregular cycle, fibroid uterus Secondary Diagnosis and Code:  Surgical Procedure: TLH/BS/Cystoscopy L&D Notification: No Admission Status: same day surgery Length of Surgery: 2 hours Special Case Needs: none H&P: TBD (date) Phone Interview???: no Interpreter: Language:  Medical Clearance: none Special Scheduling Instructions: patient needs endometrial sampling on day of H&P, please make patient aware that I will need to do a biopsy.  I will send her a message through [facility], as well.

## 2017-12-22 ENCOUNTER — Other Ambulatory Visit: Payer: Self-pay | Admitting: Internal Medicine

## 2017-12-22 DIAGNOSIS — Z1231 Encounter for screening mammogram for malignant neoplasm of breast: Secondary | ICD-10-CM

## 2018-01-08 ENCOUNTER — Ambulatory Visit (INDEPENDENT_AMBULATORY_CARE_PROVIDER_SITE_OTHER): Payer: BC Managed Care – PPO | Admitting: Obstetrics and Gynecology

## 2018-01-08 ENCOUNTER — Encounter: Payer: Self-pay | Admitting: Obstetrics and Gynecology

## 2018-01-08 ENCOUNTER — Other Ambulatory Visit (HOSPITAL_COMMUNITY)
Admission: RE | Admit: 2018-01-08 | Discharge: 2018-01-08 | Disposition: A | Discharge status: Home / Self Care | Payer: BC Managed Care – PPO | Source: Ambulatory Visit | Attending: Obstetrics and Gynecology | Admitting: Obstetrics and Gynecology

## 2018-01-08 VITALS — BP 116/64 | HR 89 | Ht 68.0 in | Wt 191.5 lb

## 2018-01-08 DIAGNOSIS — N921 Excessive and frequent menstruation with irregular cycle: Secondary | ICD-10-CM

## 2018-01-08 DIAGNOSIS — D251 Intramural leiomyoma of uterus: Secondary | ICD-10-CM

## 2018-01-08 NOTE — Progress Notes (Signed)
    Patient presents for an endometrial biopsy in anticipation of surgery.  She is having mild bleeding today. No other complaints.   Endometrial Biopsy After discussion with the patient regarding her abnormal uterine bleeding I recommended that she proceed with an endometrial biopsy for further diagnosis. The risks, benefits, alternatives, and indications for an endometrial biopsy were discussed with the patient in detail. She understood the risks including infection, bleeding, cervical laceration and uterine perforation.  Verbal consent was obtained.   PROCEDURE NOTE:  [place] endometrial biopsy was performed using aseptic technique with iodine preparation.  The uterus was sounded to a length of 8 cm.  Adequate sampling was obtained with minimal blood loss.  The patient tolerated the procedure well.  Disposition will be pending pathology.  Prentice Docker, MD  [facility], [facility] 01/08/2018  8:23 AM

## 2018-03-16 ENCOUNTER — Telehealth: Payer: Self-pay | Admitting: Obstetrics and Gynecology

## 2018-03-16 NOTE — Telephone Encounter (Signed)
Contacted patient to let her know the date of her H&P and Pre-admit appointments, Dr Glennon Mac is scheduled in [place]. Patient is rescheduled for 11/12 @ 8:50am w/ Dr Glennon Mac in [place], and I have left a message w/ Caryl Pina @ Pre-admit Testing to reschedule the Pre-admit appointment to follow.

## 2018-05-08 ENCOUNTER — Other Ambulatory Visit: Payer: Self-pay

## 2018-05-08 ENCOUNTER — Ambulatory Visit (INDEPENDENT_AMBULATORY_CARE_PROVIDER_SITE_OTHER): Payer: BC Managed Care – PPO | Admitting: Obstetrics and Gynecology

## 2018-05-08 ENCOUNTER — Encounter: Payer: Self-pay | Admitting: Obstetrics and Gynecology

## 2018-05-08 ENCOUNTER — Encounter
Admission: RE | Admit: 2018-05-08 | Discharge: 2018-05-08 | Disposition: A | Discharge status: Home / Self Care | Payer: BC Managed Care – PPO | Source: Ambulatory Visit | Attending: Obstetrics and Gynecology | Admitting: Obstetrics and Gynecology

## 2018-05-08 VITALS — BP 122/74 | Ht 68.0 in | Wt 198.0 lb

## 2018-05-08 DIAGNOSIS — N921 Excessive and frequent menstruation with irregular cycle: Secondary | ICD-10-CM

## 2018-05-08 DIAGNOSIS — D251 Intramural leiomyoma of uterus: Secondary | ICD-10-CM | POA: Diagnosis not present

## 2018-05-08 DIAGNOSIS — Z01812 Encounter for preprocedural laboratory examination: Secondary | ICD-10-CM | POA: Diagnosis present

## 2018-05-08 LAB — COMPREHENSIVE METABOLIC PANEL
ALK PHOS: 54 U/L (ref 38–126)
ALT: 15 U/L (ref 0–44)
AST: 14 U/L — ABNORMAL LOW (ref 15–41)
Albumin: 4.1 g/dL (ref 3.5–5.0)
Anion gap: 9 (ref 5–15)
BUN: 16 mg/dL (ref 6–20)
CALCIUM: 9.6 mg/dL (ref 8.9–10.3)
CO2: 26 mmol/L (ref 22–32)
CREATININE: 0.66 mg/dL (ref 0.44–1.00)
Chloride: 102 mmol/L (ref 98–111)
GFR calc non Af Amer: >60 mL/min (ref >60)
Glucose, Bld: 95 mg/dL (ref 70–99)
Potassium: 4.2 mmol/L (ref 3.5–5.1)
Sodium: 137 mmol/L (ref 135–145)
Total Bilirubin: 0.7 mg/dL (ref 0.3–1.2)
Total Protein: 7.9 g/dL (ref 6.5–8.1)

## 2018-05-08 LAB — CBC
HCT: 42.6 % (ref 36.0–46.0)
HEMOGLOBIN: 14.5 g/dL (ref 12.0–15.0)
MCH: 31.7 pg (ref 26.0–34.0)
MCHC: 34 g/dL (ref 30.0–36.0)
MCV: 93.2 fL (ref 80.0–100.0)
Platelets: 248 10*3/uL (ref 150–400)
RBC: 4.57 MIL/uL (ref 3.87–5.11)
RDW: 12 % (ref 11.5–15.5)
WBC: 10 10*3/uL (ref 4.0–10.5)
nRBC: 0 % (ref 0.0–0.2)

## 2018-05-08 LAB — TYPE AND SCREEN
ABO/RH(D): A POS
ANTIBODY SCREEN: NEGATIVE

## 2018-05-08 NOTE — Patient Instructions (Signed)
Your procedure is scheduled on: Tuesday, May 22, 2018 Report to Day Surgery on the 2nd floor of the [facility]. To find out your arrival time, please call [phone] between 1PM - 3PM on: Monday, May 21, 2018  REMEMBER: Instructions that are not followed completely may result in serious medical risk, up to and including death; or upon the discretion of your surgeon and anesthesiologist your surgery may need to be rescheduled.  Do not eat food after midnight the night before surgery.  No gum chewing, lozengers or hard candies.  You may however, drink CLEAR liquids up to 2 hours before you are scheduled to arrive for your surgery. Do not drink anything within 2 hours of the start of your surgery.  Clear liquids include: - water  - apple juice without pulp - gatorade - black coffee or tea (Do NOT add milk or creamers to the coffee or tea) Do NOT drink anything that is not on this list.  No Alcohol for 24 hours before or after surgery.  No Smoking including e-cigarettes for 24 hours prior to surgery.  No chewable tobacco products for at least 6 hours prior to surgery.  No nicotine patches on the day of surgery.  On the morning of surgery brush your teeth with toothpaste and water, you may rinse your mouth with mouthwash if you wish. Do not swallow any toothpaste or mouthwash.  Notify your doctor if there is any change in your medical condition (cold, fever, infection).  Do not wear jewelry, make-up, hairpins, clips or nail polish.  Do not wear lotions, powders, or perfumes.   Do not shave 48 hours prior to surgery.   Contacts and dentures may not be worn into surgery.  Do not bring valuables to the hospital, including drivers license, insurance or credit cards.  [facility] is not responsible for any belongings or valuables.   TAKE THESE MEDICATIONS THE MORNING OF SURGERY:  none  Use CHG Soap as directed on instruction sheet.  On November 19 - Stop aspirin and  Anti-inflammatories (NSAIDS) such as Advil, Aleve, Ibuprofen, Motrin, Naproxen, Naprosyn and Aspirin based products such as Excedrin, Goodys Powder, BC Powder. (May take Tylenol or Acetaminophen if needed.)  On November 19 - Stop ANY OVER THE COUNTER supplements until after surgery. (May continue Vitamin D, Vitamin B, and multivitamin.)  Wear comfortable clothing (specific to your surgery type) to the hospital.  Plan for stool softeners for home use.  If you are being discharged the day of surgery, you will not be allowed to drive home. You will need a responsible adult to drive you home and stay with you that night.   If you are taking public transportation, you will need to have a responsible adult with you. Please confirm with your physician that it is acceptable to use public transportation.   Please call [phone] if you have any questions about these instructions.

## 2018-05-08 NOTE — H&P (View-Only) (Signed)
Preoperative History and Physical  Emily Jensen is a 42 y.o. [phone] here for surgical management of menorrhagia with irregular cycle and fibroid uterus.   No significant preoperative concerns.  History of Present Illness: 42 y.o. G79P2002 female who presents for abnormal uterine bleeding (menorrhagia with irregular cycle). She has tried and failed multiple forms of hormonal contraception, including combined OCPs and a Mirena IUD.  Most recently she has failed lo loestrin.  She had a sonohysterogram two years ago that showed no defects in the cavity of her uterus. More recently she had an ultrasound that showed a couple of small fibroids, one noted as possibly submucosal.  Last pap smear 12/2015, NILM, HPV negative.  She had a negative endometrial biopsy in July this year. She now desires definitive management.   Proposed surgery: Total Laparoscopic Hysterectomy, bilateral salpingectomy, cystoscopy  Past Medical History:  Diagnosis Date  . Breast mass 2014   left breast 12 o'clcok  . Chicken pox   . Genital herpes   . Menometrorrhagia    Past Surgical History:  Procedure Laterality Date  . BREAST BIOPSY Left 10/11/2012   fibroadenoma removed  . EYE SURGERY  2008   Lasix  . KNEE SURGERY Left 1992   arthroscopy for Torn meniscus & ACL  . MOUTH SURGERY  2000   gum graft  . WISDOM TOOTH EXTRACTION  2000   OB History  Gravida Para Term Preterm AB Living  2 2 2     2   SAB TAB Ectopic Multiple Live Births          2    # Outcome Date GA Lbr Len/2nd Weight Sex Delivery Anes PTL Lv  2 Term 03/02/06    M Vag-Spont   LIV  1 Term 02/23/05    M Vag-Spont   LIV    Obstetric Comments  1st Menstrual Cycle:  18  1st Pregnancy:  29  Patient denies any other pertinent gynecologic issues.   Current Outpatient Medications on File Prior to Visit  Medication Sig Dispense Refill  . b complex vitamins tablet Take 1 tablet by mouth daily.    . cholecalciferol (VITAMIN D) 1000 units tablet  Take 1,000 Units by mouth daily.    . Hydrocortisone (PREPARATION H EX) Place 1 application rectally daily as needed (hemorrhoids).    . Inulin (FIBER CHOICE PO) Take 1 tablet by mouth daily.    . Norethindrone-Ethinyl Estradiol-Fe Biphas (LO LOESTRIN FE) 1 MG-10 MCG / 10 MCG tablet Take 1 tablet by mouth daily. (Patient not taking: Reported on 01/08/2018) 84 tablet 0   No current facility-administered medications on file prior to visit.    Allergies  Allergen Reactions  . Codeine Palpitations and Other (See Comments)    Social History:   reports that she has quit smoking. Her smoking use included cigarettes. She has never used smokeless tobacco. She reports that she drinks alcohol. She reports that she does not use drugs.  Family History  Problem Relation Age of Onset  . Hypertension Mother   . Heart disease Father 63       CAD - Quadruple bypass/ MI x 2  . Hypertension Father   . Diabetes Paternal Grandfather   . Cancer Maternal Grandfather 52       Colon Cancer    Review of Systems: Noncontributory  PHYSICAL EXAM: Blood pressure 122/74, height 5\' 8"  (1.727 m), weight 198 lb (89.8 kg). CONSTITUTIONAL: Well-developed, well-nourished female in no acute distress.  HENT:  Normocephalic, atraumatic, External right and left ear normal. Oropharynx is clear and moist EYES: Conjunctivae and EOM are normal. Pupils are equal, round, and reactive to light. No scleral icterus.  NECK: Normal range of motion, supple, no masses SKIN: Skin is warm and dry. No rash noted. Not diaphoretic. No erythema. No pallor. [place]: Alert and oriented to person, place, and time. Normal reflexes, muscle tone coordination. No cranial nerve deficit noted. PSYCHIATRIC: Normal mood and affect. Normal behavior. Normal judgment and thought content. CARDIOVASCULAR: Normal heart rate noted, regular rhythm RESPIRATORY: Effort and breath sounds normal, no problems with respiration noted ABDOMEN: Soft, nontender,  nondistended. PELVIC: Deferred MUSCULOSKELETAL: Normal range of motion. No edema and no tenderness. 2+ distal pulses.  Assessment: Patient Active Problem List   Diagnosis Date Noted  . Fibroid uterus 12/13/2017  . Menorrhagia with irregular cycle 09/27/2014    Plan: Patient will undergo surgical management with the above-proposed surgery.   The risks of surgery were discussed in detail with the patient including but not limited to: bleeding which may require transfusion or reoperation; infection which may require antibiotics; injury to surrounding organs which may involve bowel, bladder, ureters ; need for additional procedures including laparoscopy or laparotomy; thromboembolic phenomenon, surgical site problems and other postoperative/anesthesia complications. Likelihood of success in alleviating the patient's condition was discussed. Routine postoperative instructions will be reviewed with the patient and her family in detail after surgery.  The patient concurred with the proposed plan, giving informed written consent for the surgery.  Preoperative prophylactic antibiotics, as indicated, and SCDs ordered on call to the OR.    Prentice Docker, MD 05/08/2018 9:05 AM

## 2018-05-08 NOTE — Progress Notes (Signed)
Preoperative History and Physical  Emily Jensen is a 42 y.o. [phone] here for surgical management of menorrhagia with irregular cycle and fibroid uterus.   No significant preoperative concerns.  History of Present Illness: Emily y.o. G76P2002 female who presents for abnormal uterine bleeding (menorrhagia with irregular cycle). Emily Jensen has tried and failed multiple forms of hormonal contraception, including combined OCPs and a Mirena IUD.  Most recently Emily Jensen has failed lo loestrin.  Emily Jensen had a sonohysterogram two years ago that showed no defects in Emily Jensen cavity of Emily Jensen uterus. More recently Emily Jensen had an ultrasound that showed a couple of small fibroids, one noted as possibly submucosal.  Last pap smear 12/2015, NILM, HPV negative.  Emily Jensen had a negative endometrial biopsy in July this year. Emily Jensen now desires definitive management.   Proposed surgery: Total Laparoscopic Hysterectomy, bilateral salpingectomy, cystoscopy  Past Medical History:  Diagnosis Date  . Breast mass 2014   left breast 12 o'clcok  . Chicken pox   . Genital herpes   . Menometrorrhagia    Past Surgical History:  Procedure Laterality Date  . BREAST BIOPSY Left 10/11/2012   fibroadenoma removed  . EYE SURGERY  2008   Lasix  . KNEE SURGERY Left 1992   arthroscopy for Torn meniscus & ACL  . MOUTH SURGERY  2000   gum graft  . WISDOM TOOTH EXTRACTION  2000   OB History  Gravida Para Term Preterm AB Living  2 2 2     2   SAB TAB Ectopic Multiple Live Births          2    # Outcome Date GA Lbr Len/2nd Weight Sex Delivery Anes PTL Lv  2 Term 03/02/06    M Vag-Spont   LIV  1 Term 02/23/05    M Vag-Spont   LIV    Obstetric Comments  1st Menstrual Cycle:  18  1st Pregnancy:  29  Emily Jensen denies any other pertinent gynecologic issues.   Current Outpatient Medications on File Prior to Visit  Medication Sig Dispense Refill  . b complex vitamins tablet Take 1 tablet by mouth daily.    . cholecalciferol (VITAMIN D) 1000 units tablet  Take 1,000 Units by mouth daily.    . Hydrocortisone (PREPARATION H EX) Place 1 application rectally daily as needed (hemorrhoids).    . Inulin (FIBER CHOICE PO) Take 1 tablet by mouth daily.    . Norethindrone-Ethinyl Estradiol-Fe Biphas (LO LOESTRIN FE) 1 MG-10 MCG / 10 MCG tablet Take 1 tablet by mouth daily. (Emily Jensen not taking: Reported on 01/08/2018) 84 tablet 0   No current facility-administered medications on file prior to visit.    Allergies  Allergen Reactions  . Codeine Palpitations and Other (See Comments)    Social History:   reports that Emily Jensen has quit smoking. Emily Jensen smoking use included cigarettes. Emily Jensen has never used smokeless tobacco. Emily Jensen reports that Emily Jensen drinks alcohol. Emily Jensen reports that Emily Jensen does not use drugs.  Family History  Problem Relation Age of Onset  . Hypertension Mother   . Heart disease Father 103       CAD - Quadruple bypass/ MI x 2  . Hypertension Father   . Diabetes Paternal Grandfather   . Cancer Maternal Grandfather 60       Colon Cancer    Review of Systems: Noncontributory  PHYSICAL EXAM: Blood pressure 122/74, height 5\' 8"  (1.727 m), weight 198 lb (89.8 kg). CONSTITUTIONAL: Well-developed, well-nourished female in no acute distress.  HENT:  Normocephalic, atraumatic, External right and left ear normal. Oropharynx is clear and moist EYES: Conjunctivae and EOM are normal. Pupils are equal, round, and reactive to light. No scleral icterus.  NECK: Normal range of motion, supple, no masses SKIN: Skin is warm and dry. No rash noted. Not diaphoretic. No erythema. No pallor. [place]: Alert and oriented to person, place, and time. Normal reflexes, muscle tone coordination. No cranial nerve deficit noted. PSYCHIATRIC: Normal mood and affect. Normal behavior. Normal judgment and thought content. CARDIOVASCULAR: Normal heart rate noted, regular rhythm RESPIRATORY: Effort and breath sounds normal, no problems with respiration noted ABDOMEN: Soft, nontender,  nondistended. PELVIC: Deferred MUSCULOSKELETAL: Normal range of motion. No edema and no tenderness. 2+ distal pulses.  Assessment: Emily Jensen Active Problem List   Diagnosis Date Noted  . Fibroid uterus 12/13/2017  . Menorrhagia with irregular cycle 09/27/2014    Plan: Emily Jensen will undergo surgical management with Emily Jensen above-proposed surgery.   Emily Jensen risks of surgery were discussed in detail with Emily Jensen Emily Jensen including but not limited to: bleeding which may require transfusion or reoperation; infection which may require antibiotics; injury to surrounding organs which may involve bowel, bladder, ureters ; need for additional procedures including laparoscopy or laparotomy; thromboembolic phenomenon, surgical site problems and other postoperative/anesthesia complications. Likelihood of success in alleviating Emily Jensen Emily Jensen's condition was discussed. Routine postoperative instructions will be reviewed with Emily Jensen Emily Jensen and Emily Jensen family in detail after surgery.  Emily Jensen Emily Jensen concurred with Emily Jensen proposed plan, giving informed written consent for Emily Jensen surgery.  Preoperative prophylactic antibiotics, as indicated, and SCDs ordered on call to Emily Jensen OR.    Prentice Docker, MD 05/08/2018 9:05 AM

## 2018-05-09 ENCOUNTER — Inpatient Hospital Stay: Admission: RE | Admit: 2018-05-09 | Payer: BC Managed Care – PPO | Source: Ambulatory Visit

## 2018-05-09 ENCOUNTER — Encounter: Payer: BC Managed Care – PPO | Admitting: Obstetrics and Gynecology

## 2018-05-21 MED ORDER — CEFAZOLIN SODIUM-DEXTROSE 2-4 GM/100ML-% IV SOLN
2.0000 g | INTRAVENOUS | Status: AC
  Administered 2018-05-22: 2 g via INTRAVENOUS

## 2018-05-22 ENCOUNTER — Ambulatory Visit: Payer: BC Managed Care – PPO | Admitting: Anesthesiology

## 2018-05-22 ENCOUNTER — Ambulatory Visit
Admission: RE | Admit: 2018-05-22 | Discharge: 2018-05-22 | Disposition: A | Discharge status: Home / Self Care | Payer: BC Managed Care – PPO | Source: Ambulatory Visit | Attending: Obstetrics and Gynecology | Admitting: Obstetrics and Gynecology

## 2018-05-22 ENCOUNTER — Other Ambulatory Visit: Payer: Self-pay

## 2018-05-22 ENCOUNTER — Encounter
Admission: RE | Disposition: A | Discharge status: Home / Self Care | Payer: Self-pay | Source: Ambulatory Visit | Attending: Obstetrics and Gynecology

## 2018-05-22 DIAGNOSIS — Z87891 Personal history of nicotine dependence: Secondary | ICD-10-CM | POA: Diagnosis not present

## 2018-05-22 DIAGNOSIS — N920 Excessive and frequent menstruation with regular cycle: Secondary | ICD-10-CM | POA: Insufficient documentation

## 2018-05-22 DIAGNOSIS — D251 Intramural leiomyoma of uterus: Secondary | ICD-10-CM

## 2018-05-22 DIAGNOSIS — N800 Endometriosis of uterus: Secondary | ICD-10-CM | POA: Insufficient documentation

## 2018-05-22 DIAGNOSIS — N921 Excessive and frequent menstruation with irregular cycle: Secondary | ICD-10-CM | POA: Diagnosis not present

## 2018-05-22 DIAGNOSIS — Z79899 Other long term (current) drug therapy: Secondary | ICD-10-CM | POA: Insufficient documentation

## 2018-05-22 DIAGNOSIS — D259 Leiomyoma of uterus, unspecified: Secondary | ICD-10-CM | POA: Diagnosis present

## 2018-05-22 HISTORY — PX: LAPAROSCOPIC HYSTERECTOMY: SHX1926

## 2018-05-22 HISTORY — PX: CYSTOSCOPY: SHX5120

## 2018-05-22 LAB — POCT PREGNANCY, URINE: Preg Test, Ur: NEGATIVE

## 2018-05-22 LAB — ABO/RH: ABO/RH(D): A POS

## 2018-05-22 SURGERY — HYSTERECTOMY, TOTAL, LAPAROSCOPIC
Anesthesia: General

## 2018-05-22 MED ORDER — ACETAMINOPHEN NICU IV SYRINGE 10 MG/ML
INTRAVENOUS | Status: AC
  Filled 2018-05-22: qty 1

## 2018-05-22 MED ORDER — ONDANSETRON HCL 4 MG/2ML IJ SOLN
INTRAMUSCULAR | Status: AC
  Filled 2018-05-22: qty 2

## 2018-05-22 MED ORDER — ROCURONIUM BROMIDE 50 MG/5ML IV SOLN
INTRAVENOUS | Status: AC
  Filled 2018-05-22: qty 1

## 2018-05-22 MED ORDER — MIDAZOLAM HCL 2 MG/2ML IJ SOLN
INTRAMUSCULAR | Status: DC | PRN
  Administered 2018-05-22: 2 mg via INTRAVENOUS

## 2018-05-22 MED ORDER — SUGAMMADEX SODIUM 200 MG/2ML IV SOLN
INTRAVENOUS | Status: DC | PRN
  Administered 2018-05-22: 200 mg via INTRAVENOUS

## 2018-05-22 MED ORDER — KETOROLAC TROMETHAMINE 30 MG/ML IJ SOLN
INTRAMUSCULAR | Status: AC
  Filled 2018-05-22: qty 1

## 2018-05-22 MED ORDER — IBUPROFEN 600 MG PO TABS: 600.0000 mg | ORAL_TABLET | Freq: Four times a day (QID) | ORAL | 0 refills | Status: AC | PRN

## 2018-05-22 MED ORDER — CEFAZOLIN SODIUM-DEXTROSE 2-4 GM/100ML-% IV SOLN
INTRAVENOUS | Status: AC
  Filled 2018-05-22: qty 100

## 2018-05-22 MED ORDER — KETOROLAC TROMETHAMINE 30 MG/ML IJ SOLN
INTRAMUSCULAR | Status: DC | PRN
  Administered 2018-05-22: 30 mg via INTRAVENOUS

## 2018-05-22 MED ORDER — DEXMEDETOMIDINE HCL IN NACL 80 MCG/20ML IV SOLN
INTRAVENOUS | Status: AC
  Filled 2018-05-22: qty 20

## 2018-05-22 MED ORDER — PROPOFOL 500 MG/50ML IV EMUL
INTRAVENOUS | Status: DC | PRN
  Administered 2018-05-22: 50 ug/kg/min via INTRAVENOUS

## 2018-05-22 MED ORDER — PROPOFOL 10 MG/ML IV BOLUS
INTRAVENOUS | Status: DC | PRN
  Administered 2018-05-22: 200 mg via INTRAVENOUS

## 2018-05-22 MED ORDER — ONDANSETRON 8 MG PO TBDP: 8.0000 mg | ORAL_TABLET | Freq: Three times a day (TID) | ORAL | 0 refills | Status: DC | PRN

## 2018-05-22 MED ORDER — PROMETHAZINE HCL 25 MG/ML IJ SOLN: 12.5000 mg | Freq: Once | INTRAMUSCULAR | Status: DC | PRN

## 2018-05-22 MED ORDER — ACETAMINOPHEN 10 MG/ML IV SOLN
INTRAVENOUS | Status: DC | PRN
  Administered 2018-05-22: 1000 mg via INTRAVENOUS

## 2018-05-22 MED ORDER — EPHEDRINE SULFATE 50 MG/ML IJ SOLN
INTRAMUSCULAR | Status: AC
  Filled 2018-05-22: qty 1

## 2018-05-22 MED ORDER — FAMOTIDINE 20 MG PO TABS
ORAL_TABLET | ORAL | Status: AC
  Administered 2018-05-22: 20 mg via ORAL
  Filled 2018-05-22: qty 1

## 2018-05-22 MED ORDER — BUPIVACAINE HCL 0.5 % IJ SOLN
INTRAMUSCULAR | Status: DC | PRN
  Administered 2018-05-22: 10 mL

## 2018-05-22 MED ORDER — FENTANYL CITRATE (PF) 100 MCG/2ML IJ SOLN
INTRAMUSCULAR | Status: DC | PRN
  Administered 2018-05-22 (×5): 50 ug via INTRAVENOUS

## 2018-05-22 MED ORDER — HYDROMORPHONE HCL 1 MG/ML IJ SOLN: 0.2500 mg | INTRAMUSCULAR | Status: DC | PRN

## 2018-05-22 MED ORDER — SEVOFLURANE IN SOLN
RESPIRATORY_TRACT | Status: AC
  Filled 2018-05-22: qty 250

## 2018-05-22 MED ORDER — PHENYLEPHRINE HCL 10 MG/ML IJ SOLN
INTRAMUSCULAR | Status: DC | PRN
  Administered 2018-05-22: 150 ug via INTRAVENOUS

## 2018-05-22 MED ORDER — EPHEDRINE SULFATE 50 MG/ML IJ SOLN
INTRAMUSCULAR | Status: DC | PRN
  Administered 2018-05-22 (×3): 5 mg via INTRAVENOUS

## 2018-05-22 MED ORDER — DEXAMETHASONE SODIUM PHOSPHATE 10 MG/ML IJ SOLN
INTRAMUSCULAR | Status: AC
  Filled 2018-05-22: qty 1

## 2018-05-22 MED ORDER — PROPOFOL 10 MG/ML IV BOLUS
INTRAVENOUS | Status: AC
  Filled 2018-05-22: qty 20

## 2018-05-22 MED ORDER — IBUPROFEN 600 MG PO TABS
600.0000 mg | ORAL_TABLET | Freq: Once | ORAL | Status: AC
  Administered 2018-05-22: 600 mg via ORAL
  Filled 2018-05-22: qty 1

## 2018-05-22 MED ORDER — BUPIVACAINE HCL (PF) 0.5 % IJ SOLN
INTRAMUSCULAR | Status: AC
  Filled 2018-05-22: qty 30

## 2018-05-22 MED ORDER — FENTANYL CITRATE (PF) 250 MCG/5ML IJ SOLN
INTRAMUSCULAR | Status: AC
  Filled 2018-05-22: qty 5

## 2018-05-22 MED ORDER — SUGAMMADEX SODIUM 200 MG/2ML IV SOLN
INTRAVENOUS | Status: AC
  Filled 2018-05-22: qty 2

## 2018-05-22 MED ORDER — DEXMEDETOMIDINE HCL 200 MCG/2ML IV SOLN
INTRAVENOUS | Status: DC | PRN
  Administered 2018-05-22 (×5): 4 ug via INTRAVENOUS

## 2018-05-22 MED ORDER — OXYCODONE-ACETAMINOPHEN 5-325 MG PO TABS: 1.0000 | ORAL_TABLET | ORAL | 0 refills | Status: DC | PRN

## 2018-05-22 MED ORDER — LACTATED RINGERS IV SOLN
INTRAVENOUS | Status: DC | PRN
  Administered 2018-05-22: 08:00:00 via INTRAVENOUS

## 2018-05-22 MED ORDER — PHENYLEPHRINE HCL 10 MG/ML IJ SOLN
INTRAMUSCULAR | Status: AC
  Filled 2018-05-22: qty 1

## 2018-05-22 MED ORDER — ROCURONIUM BROMIDE 100 MG/10ML IV SOLN
INTRAVENOUS | Status: DC | PRN
  Administered 2018-05-22: 10 mg via INTRAVENOUS
  Administered 2018-05-22: 40 mg via INTRAVENOUS
  Administered 2018-05-22: 10 mg via INTRAVENOUS
  Administered 2018-05-22: 20 mg via INTRAVENOUS
  Administered 2018-05-22: 10 mg via INTRAVENOUS

## 2018-05-22 MED ORDER — LIDOCAINE HCL (PF) 2 % IJ SOLN
INTRAMUSCULAR | Status: AC
  Filled 2018-05-22: qty 10

## 2018-05-22 MED ORDER — DEXAMETHASONE SODIUM PHOSPHATE 10 MG/ML IJ SOLN
INTRAMUSCULAR | Status: DC | PRN
  Administered 2018-05-22: 10 mg via INTRAVENOUS

## 2018-05-22 MED ORDER — IBUPROFEN 600 MG PO TABS
ORAL_TABLET | ORAL | Status: AC
  Filled 2018-05-22: qty 1

## 2018-05-22 MED ORDER — FAMOTIDINE 20 MG PO TABS
20.0000 mg | ORAL_TABLET | Freq: Once | ORAL | Status: AC
  Administered 2018-05-22: 20 mg via ORAL

## 2018-05-22 MED ORDER — MIDAZOLAM HCL 2 MG/2ML IJ SOLN
INTRAMUSCULAR | Status: AC
  Filled 2018-05-22: qty 2

## 2018-05-22 MED ORDER — ONDANSETRON HCL 4 MG/2ML IJ SOLN
INTRAMUSCULAR | Status: DC | PRN
  Administered 2018-05-22: 4 mg via INTRAVENOUS

## 2018-05-22 MED ORDER — PROPOFOL 10 MG/ML IV BOLUS
INTRAVENOUS | Status: AC
  Filled 2018-05-22: qty 40

## 2018-05-22 MED ORDER — LACTATED RINGERS IV SOLN
INTRAVENOUS | Status: DC
  Administered 2018-05-22: 06:00:00 via INTRAVENOUS

## 2018-05-22 MED ORDER — LIDOCAINE HCL (CARDIAC) PF 100 MG/5ML IV SOSY
PREFILLED_SYRINGE | INTRAVENOUS | Status: DC | PRN
  Administered 2018-05-22: 100 mg via INTRAVENOUS

## 2018-05-22 SURGICAL SUPPLY — 59 items
BAG URINE DRAINAGE (UROLOGICAL SUPPLIES) ×4 IMPLANT
BLADE SURG SZ11 CARB STEEL (BLADE) ×4 IMPLANT
CATH FOLEY 2WAY  5CC 16FR (CATHETERS) ×2
CATH ROBINSON RED A/P 16FR (CATHETERS) ×4 IMPLANT
CATH URTH 16FR FL 2W BLN LF (CATHETERS) ×2 IMPLANT
CHLORAPREP W/TINT 26ML (MISCELLANEOUS) ×4 IMPLANT
COVER WAND RF STERILE (DRAPES) ×4 IMPLANT
DEFOGGER SCOPE WARMER CLEARIFY (MISCELLANEOUS) ×4 IMPLANT
DERMABOND ADVANCED (GAUZE/BANDAGES/DRESSINGS) ×2
DERMABOND ADVANCED .7 DNX12 (GAUZE/BANDAGES/DRESSINGS) ×2 IMPLANT
DEVICE SUTURE ENDOST 10MM (ENDOMECHANICALS) ×4 IMPLANT
DRAPE LEGGINS SURG 28X43 STRL (DRAPES) ×4 IMPLANT
DRAPE SHEET LG 3/4 BI-LAMINATE (DRAPES) ×4 IMPLANT
DRAPE UNDER BUTTOCK W/FLU (DRAPES) ×4 IMPLANT
GAUZE 4X4 16PLY RFD (DISPOSABLE) ×4 IMPLANT
GLOVE BIO SURGEON STRL SZ7 (GLOVE) ×12 IMPLANT
GLOVE BIOGEL PI IND STRL 7.5 (GLOVE) ×2 IMPLANT
GLOVE BIOGEL PI INDICATOR 7.5 (GLOVE) ×2
GLOVE INDICATOR 7.5 STRL GRN (GLOVE) ×4 IMPLANT
GOWN STRL REUS W/ TWL LRG LVL3 (GOWN DISPOSABLE) ×6 IMPLANT
GOWN STRL REUS W/ TWL XL LVL3 (GOWN DISPOSABLE) ×2 IMPLANT
GOWN STRL REUS W/TWL LRG LVL3 (GOWN DISPOSABLE) ×6
GOWN STRL REUS W/TWL XL LVL3 (GOWN DISPOSABLE) ×2
GRASPER SUT TROCAR 14GX15 (MISCELLANEOUS) ×4 IMPLANT
IRRIGATION STRYKERFLOW (MISCELLANEOUS) ×2 IMPLANT
IRRIGATOR STRYKERFLOW (MISCELLANEOUS) ×4
IV LACTATED RINGERS 1000ML (IV SOLUTION) ×8 IMPLANT
KIT PINK PAD W/HEAD ARE REST (MISCELLANEOUS) ×4
KIT PINK PAD W/HEAD ARM REST (MISCELLANEOUS) ×2 IMPLANT
KIT TURNOVER CYSTO (KITS) ×4 IMPLANT
LABEL OR SOLS (LABEL) ×4 IMPLANT
LIGASURE VESSEL 5MM BLUNT TIP (ELECTROSURGICAL) ×2 IMPLANT
MANIPULATOR VCARE LG CRV RETR (MISCELLANEOUS) ×2 IMPLANT
MANIPULATOR VCARE SML CRV RETR (MISCELLANEOUS) IMPLANT
MANIPULATOR VCARE STD CRV RETR (MISCELLANEOUS) IMPLANT
NEEDLE HYPO 22GX1.5 SAFETY (NEEDLE) ×4 IMPLANT
OCCLUDER COLPOPNEUMO (BALLOONS) ×4 IMPLANT
PACK LAP CHOLECYSTECTOMY (MISCELLANEOUS) ×4 IMPLANT
PAD OB MATERNITY 4.3X12.25 (PERSONAL CARE ITEMS) ×4 IMPLANT
PAD PREP 24X41 OB/GYN DISP (PERSONAL CARE ITEMS) ×4 IMPLANT
PORT ACCESS TROCAR AIRSEAL 5 (TROCAR) ×2 IMPLANT
SCISSORS METZENBAUM CVD 33 (INSTRUMENTS) ×4 IMPLANT
SET CYSTO W/LG BORE CLAMP LF (SET/KITS/TRAYS/PACK) ×4 IMPLANT
SET TRI-LUMEN FLTR TB AIRSEAL (TUBING) ×2 IMPLANT
SLEEVE ENDOPATH XCEL 5M (ENDOMECHANICALS) ×2 IMPLANT
SOL PREP PVP 2OZ (MISCELLANEOUS) ×4
SOLUTION PREP PVP 2OZ (MISCELLANEOUS) ×2 IMPLANT
SURGILUBE 2OZ TUBE FLIPTOP (MISCELLANEOUS) ×4 IMPLANT
SUT ENDO VLOC 180-0-8IN (SUTURE) ×6 IMPLANT
SUT MNCRL 4-0 (SUTURE) ×4
SUT MNCRL 4-0 27XMFL (SUTURE) ×4
SUT VIC AB 0 CT1 36 (SUTURE) ×6 IMPLANT
SUTURE MNCRL 4-0 27XMF (SUTURE) ×2 IMPLANT
SYR 10ML LL (SYRINGE) ×8 IMPLANT
SYR 50ML LL SCALE MARK (SYRINGE) ×4 IMPLANT
SYSTEM WECK SHIELD CLOSURE (TROCAR) ×2 IMPLANT
TROCAR ENDO BLADELESS 11MM (ENDOMECHANICALS) ×4 IMPLANT
TROCAR XCEL NON-BLD 5MMX100MML (ENDOMECHANICALS) ×4 IMPLANT
TUBING INSUF HEATED (TUBING) ×4 IMPLANT

## 2018-05-22 NOTE — Interval H&P Note (Signed)
History and Physical Interval Note:  05/22/2018 7:28 AM  Emily Jensen  has presented today for surgery, with the diagnosis of menorrhagia irregular cycle,fibroid uterus  The various methods of treatment have been discussed with the patient and family. After consideration of risks, benefits and other options for treatment, the patient has consented to  Procedure(s): HYSTERECTOMY TOTAL LAPAROSCOPIC BILATERAL SALPINGECTOMY (Bilateral) CYSTOSCOPY (N/A) as a surgical intervention .  The patient's history has been reviewed, patient examined, no change in status, stable for surgery.  I have reviewed the patient's chart and labs.  Questions were answered to the patient's satisfaction.    Prentice Docker, MD, [facility], [facility] 05/22/2018 7:28 AM

## 2018-05-22 NOTE — Anesthesia Procedure Notes (Signed)
Procedure Name: Intubation Date/Time: 05/22/2018 7:51 AM Performed by: Lavone Orn, CRNA Pre-anesthesia Checklist: Patient identified, Emergency Drugs available, Suction available, Patient being monitored and Timeout performed Patient Re-evaluated:Patient Re-evaluated prior to induction Oxygen Delivery Method: Circle system utilized Preoxygenation: Pre-oxygenation with 100% oxygen Induction Type: IV induction Ventilation: Mask ventilation without difficulty Laryngoscope Size: Mac and 4 Grade View: Grade I Tube type: Oral Tube size: 7.0 mm Number of attempts: 1 Airway Equipment and Method: Stylet Placement Confirmation: ETT inserted through vocal cords under direct vision,  positive ETCO2 and breath sounds checked- equal and bilateral Secured at: 22 cm Tube secured with: Tape Dental Injury: Teeth and Oropharynx as per pre-operative assessment

## 2018-05-22 NOTE — Discharge Instructions (Addendum)

## 2018-05-22 NOTE — Anesthesia Post-op Follow-up Note (Signed)
Anesthesia QCDR form completed.        

## 2018-05-22 NOTE — Anesthesia Postprocedure Evaluation (Signed)
Anesthesia Post Note  Patient: OTHEL DICOSTANZO  Procedure(s) Performed: HYSTERECTOMY TOTAL LAPAROSCOPIC BILATERAL SALPINGECTOMY (Bilateral ) CYSTOSCOPY (N/A )  Patient location during evaluation: PACU Anesthesia Type: General Level of consciousness: awake and alert Pain management: pain level controlled Vital Signs Assessment: post-procedure vital signs reviewed and stable Respiratory status: spontaneous breathing, nonlabored ventilation and respiratory function stable Cardiovascular status: blood pressure returned to baseline and stable Postop Assessment: no apparent nausea or vomiting Anesthetic complications: no     Last Vitals:  Vitals:   05/22/18 1123 05/22/18 1136  BP: 108/75 105/64  Pulse: 95 72  Resp: 17 18  Temp:  36.4 C  SpO2: 99% 100%    Last Pain:  Vitals:   05/22/18 1136  TempSrc: Temporal  PainSc: 3        Pt did well post op, no "craziness" as she experienced after other anesthetics.  Recommend using Precedex for future anesthetics.          Durenda Hurt

## 2018-05-22 NOTE — Transfer of Care (Signed)
Immediate Anesthesia Transfer of Care Note  Patient: Emily Jensen  Procedure(s) Performed: HYSTERECTOMY TOTAL LAPAROSCOPIC BILATERAL SALPINGECTOMY (Bilateral ) CYSTOSCOPY (N/A )  Patient Location: PACU  Anesthesia Type:General  Level of Consciousness: awake and drowsy  Airway & Oxygen Therapy: Patient Spontanous Breathing and Patient connected to face mask  Post-op Assessment: Report given to RN and Post -op Vital signs reviewed and stable  Post vital signs: stable  Last Vitals:  Vitals Value Taken Time  BP 121/81 05/22/2018 10:08 AM  Temp    Pulse 86 05/22/2018 10:16 AM  Resp 17 05/22/2018 10:16 AM  SpO2 100 % 05/22/2018 10:16 AM  Vitals shown include unvalidated device data.  Last Pain: There were no vitals filed for this visit.       Complications: No apparent anesthesia complications

## 2018-05-22 NOTE — Anesthesia Preprocedure Evaluation (Addendum)
Anesthesia Evaluation  Patient identified by MRN, date of birth, ID band Patient awake    Reviewed: Allergy & Precautions, H&P , NPO status , Patient's Chart, lab work & pertinent test results  History of Anesthesia Complications (+) history of anesthetic complications ("woke up wild, difficult to control")  Airway Mallampati: III       Dental  (+) Teeth Intact   Pulmonary former smoker,           Cardiovascular negative cardio ROS       Neuro/Psych negative neurological ROS  negative psych ROS   GI/Hepatic negative GI ROS, Neg liver ROS,   Endo/Other  negative endocrine ROS  Renal/GU      Musculoskeletal   Abdominal   Peds  Hematology negative hematology ROS (+)   Anesthesia Other Findings Past Medical History: 2014: Breast mass     Comment:  left breast 12 o'clcok No date: Chicken pox No date: Genital herpes No date: Menometrorrhagia  Past Surgical History: 10/11/2012: BREAST BIOPSY; Left     Comment:  fibroadenoma removed 2008: EYE SURGERY     Comment:  Lasix 1992: KNEE SURGERY; Left     Comment:  arthroscopy for Torn meniscus & ACL 2000: MOUTH SURGERY     Comment:  gum graft 2000: WISDOM TOOTH EXTRACTION     Reproductive/Obstetrics negative OB ROS                            Anesthesia Physical Anesthesia Plan  ASA: II  Anesthesia Plan: General ETT   Post-op Pain Management:    Induction:   PONV Risk Score and Plan: Ondansetron, Dexamethasone and Midazolam  Airway Management Planned:   Additional Equipment:   Intra-op Plan:   Post-operative Plan:   Informed Consent: I have reviewed the patients History and Physical, chart, labs and discussed the procedure including the risks, benefits and alternatives for the proposed anesthesia with the patient or authorized representative who has indicated his/her understanding and acceptance.   Dental Advisory  Given  Plan Discussed with: Anesthesiologist and CRNA  Anesthesia Plan Comments:        Anesthesia Quick Evaluation

## 2018-05-22 NOTE — Op Note (Signed)
Operative Note    Pre-Op Diagnosis:  1) menorrhagia with irregular menses 2) fibroid uterus  Post-Op Diagnosis:  1) menorrhagia with irregular menses 2) fibroid uterus  Procedures:  1. Total laparoscopic hysterectomy, bilateral salpingectomy 2. cystoscopy  Primary Surgeon: Prentice Docker, MD   Assistant Surgeon: Barnett Applebaum, MD.  No other capable assistant available, in surgery requiring high level assistant.  EBL: 50 mL   IVF: 1,200 mL   Urine output: 300 mL clear urine at end of procedure  Specimens: Uterus with cervix and bilateral fallopian tubes  Drains: none  Complications: None   Disposition: PACU   Condition: Stable   Findings:  1) enlarged uterus with right anterior fibroid 2) normal appearing cervix, bilateral fallopian tubes, and ovaries 3) cystoscopy with no defects of bladder wall and positive efflux of urine in the bilateral ureteral orifices  Procedure Summary:  The patient was taken to the operating room where general anesthesia was administered and found to be adequate. She was placed in the dorsal supine lithotomy position in [place] stirrups and prepped and draped in usual, sterile fashion. After a timeout was called, an indwelling catheter was placed in her bladder. A sterile speculum was placed in the vagina and a single-tooth tenaculum was used to grasp the anterior lip of the cervix. A V-Care uterine manipulator was affixed to the uterus in accordance to the manufacturers recommendations. The speculum and tenaculum was removed from the vagina.  Attention was turned to the abdomen where, after injection of local anesthetic, a 5 mm infraumbilical incision was made with the scalpel. Entry into the abdomen was obtained via Optiview trocar technique (a blunt entry technique with camera visualization through the obturator upon entry). Verification of entry into the abdomen was obtained using opening pressures. The abdomen was insufflated with CO2. The  camera was introduced through the trocar with verification of atraumatic entry. A left lower quadrant 8 mm port was created via direct intra-abdominal camera visualization without difficulty. An 11 mm right lower quadrant port was placed in a similar fashion without difficulty.  After inspection of the abdomen and pelvis with the above-noted findings, the bilateral ureters were identified and found to be well away from the operative area of interest. The right fallopian tube was grasped at the fimbriated end and was transected using the LigaSure along the mesosalpinx in a lateral to medial fashion. The LigaSure then was used to transect the right round ligament and the utero-ovarian ligament was transected. Tissue was divided along the right broad ligament to the level of the interior cervical os. Bladder tissue were dissected off the lower uterine segment and cervix without difficulty. The right uterine artery was skeletonized and identified and after ligation was transected with the LigaSure device. The same procedure was carried out on the left side. The colpotomy was performed using monopolar electrocautery in a circumferential fashion following the KOH ring.  Once liberated, the uterus and fallopian tubes and cervix were removed through the vagina.  Closure of the vaginal cuff was undertaken using the V-lock stitch in a running fashion. A gloved hand was placed in the vagina to assess adequate closure of the vaginal cuff. Initially a defect was noted. So, two more passes with a different V-lock stitch were thrown and the defect was verified to be closed.  All vascular pedicles were inspected and found to be hemostatic.   Attention was returned to the pelvis and cystoscopy was undertaken at this point. The Foley catheter was removed and the 70  cystoscope was gently introduced through the urethra. The bladder survey was undertaken with efflux of urine from both orifices noted. There were no defects noted  in the bladder wall. The cystoscope was removed and the Foley catheter was replaced.  Attention was returned to the abdomen and copious irrigation was undertaken and hemostasis was again verified after lowering the intra-abdominal pressure to 5 mmHg. The right lower quadrant trocar was removed and the fascia was reapproximated using #0 Vicryl with a single stitch. The abdomen was then desufflated of CO2 after removal of all instruments. Five deep breaths were given by anesthesia to the patient to help with removal of CO2 from the abdomen. All skin incision sites were closed using 4-0 Vicryl in a subcuticular fashion. A layer of surgical skin glue was placed over the incision sites, as well. The catheter was then removed from the bladder. The vagina was inspected and found to be free of instrumentation and sponges.  Sponge, lap, needle, and instrument counts were correct x 2.  VTE prophylaxis: pneumatic compression stockings were in place throughout the entire procedure. Antibiotic prophylaxis: Ancef 2 g given within 1 hour of skin incision.The patient was then awakened and taken to the recovery room in stable condition.   Prentice Docker, MD 05/22/2018 9:50 AM

## 2018-05-23 ENCOUNTER — Telehealth: Payer: Self-pay | Admitting: Obstetrics and Gynecology

## 2018-05-23 ENCOUNTER — Encounter: Payer: Self-pay | Admitting: Obstetrics and Gynecology

## 2018-05-23 NOTE — Telephone Encounter (Signed)
Called patient to check on her post -op. She is doing well. She is ambulating, voiding, tolerating PO. Her pain is well controlled.  She has no chest pain, trouble breathing, bleeding.  Her incision sites are sore, but doing well. Encouraged her to call with any concerns.

## 2018-05-25 LAB — SURGICAL PATHOLOGY

## 2018-05-30 ENCOUNTER — Ambulatory Visit (INDEPENDENT_AMBULATORY_CARE_PROVIDER_SITE_OTHER): Payer: BC Managed Care – PPO | Admitting: Obstetrics and Gynecology

## 2018-05-30 ENCOUNTER — Encounter: Payer: Self-pay | Admitting: Obstetrics and Gynecology

## 2018-05-30 VITALS — BP 122/74 | Ht 68.0 in | Wt 198.0 lb

## 2018-05-30 DIAGNOSIS — N921 Excessive and frequent menstruation with irregular cycle: Secondary | ICD-10-CM

## 2018-05-30 DIAGNOSIS — D251 Intramural leiomyoma of uterus: Secondary | ICD-10-CM

## 2018-05-30 DIAGNOSIS — Z09 Encounter for follow-up examination after completed treatment for conditions other than malignant neoplasm: Secondary | ICD-10-CM

## 2018-05-30 NOTE — Progress Notes (Signed)
   Postoperative Follow-up Patient presents post op from TLH/BS/Cysto 1 week ago for abnormal uterine bleeding (menorrhagia with irregular menses), fibroid uterus.  Subjective: Patient reports marked improvement in her preop symptoms. Eating a regular diet without difficulty. Pain is controlled with current analgesics. Medications being used: aspirin and ibuprofen (OTC).  Activity: increasing slowly. Going back to work tomorrow (not physical job).  Objective: Vitals:   05/30/18 1632  BP: 122/74   Vital Signs: BP 122/74   Ht 5\' 8"  (1.727 m)   Wt 198 lb (89.8 kg)   LMP 05/18/2018   BMI 30.11 kg/m  Constitutional: Well nourished, well developed female in no acute distress.  HEENT: normal Skin: Warm and dry.  Extremity: no edema  Abdomen: Soft, non-tender, normal bowel sounds; no bruits, organomegaly or masses. clean, dry, intact and No erythema, induration, warmth, and tenderness  Pelvic exam: deferred  Assessment: 42 y.o. s/p TLH/BS/Cysto progressing well  Plan: Patient has done well after surgery with no apparent complications.  I have discussed the post-operative course to date, and the expected progress moving forward.  The patient understands what complications to be concerned about.  I will see the patient in routine follow up, or sooner if needed.    Activity plan: increase activity slowly  Return in about 5 weeks (around 07/04/2018) for Six week post op visit.   Prentice Docker 05/30/2018, 4:43 PM

## 2018-06-02 ENCOUNTER — Other Ambulatory Visit: Payer: Self-pay

## 2018-06-02 ENCOUNTER — Observation Stay
Admission: EM | Admit: 2018-06-02 | Discharge: 2018-06-03 | Disposition: A | Discharge status: Home / Self Care | Payer: BC Managed Care – PPO | Attending: Obstetrics and Gynecology | Admitting: Obstetrics and Gynecology

## 2018-06-02 ENCOUNTER — Encounter: Payer: Self-pay | Admitting: Emergency Medicine

## 2018-06-02 DIAGNOSIS — Z885 Allergy status to narcotic agent status: Secondary | ICD-10-CM | POA: Diagnosis not present

## 2018-06-02 DIAGNOSIS — Z79899 Other long term (current) drug therapy: Secondary | ICD-10-CM | POA: Insufficient documentation

## 2018-06-02 DIAGNOSIS — N938 Other specified abnormal uterine and vaginal bleeding: Secondary | ICD-10-CM | POA: Diagnosis not present

## 2018-06-02 DIAGNOSIS — N939 Abnormal uterine and vaginal bleeding, unspecified: Secondary | ICD-10-CM | POA: Diagnosis present

## 2018-06-02 DIAGNOSIS — Z791 Long term (current) use of non-steroidal anti-inflammatories (NSAID): Secondary | ICD-10-CM | POA: Diagnosis not present

## 2018-06-02 DIAGNOSIS — Z90710 Acquired absence of both cervix and uterus: Secondary | ICD-10-CM | POA: Diagnosis not present

## 2018-06-02 DIAGNOSIS — Z9889 Other specified postprocedural states: Secondary | ICD-10-CM | POA: Insufficient documentation

## 2018-06-02 DIAGNOSIS — Z87891 Personal history of nicotine dependence: Secondary | ICD-10-CM | POA: Diagnosis not present

## 2018-06-02 DIAGNOSIS — N99820 Postprocedural hemorrhage and hematoma of a genitourinary system organ or structure following a genitourinary system procedure: Secondary | ICD-10-CM | POA: Diagnosis present

## 2018-06-02 LAB — CBC WITH DIFFERENTIAL/PLATELET
Abs Immature Granulocytes: 0.04 10*3/uL (ref 0.00–0.07)
Basophils Absolute: 0.1 10*3/uL (ref 0.0–0.1)
Basophils Relative: 1 %
Eosinophils Absolute: 0.3 10*3/uL (ref 0.0–0.5)
Eosinophils Relative: 4 %
HCT: 38.7 % (ref 36.0–46.0)
Hemoglobin: 13.3 g/dL (ref 12.0–15.0)
Immature Granulocytes: 1 %
Lymphocytes Relative: 27 %
Lymphs Abs: 2.3 10*3/uL (ref 0.7–4.0)
MCH: 31.8 pg (ref 26.0–34.0)
MCHC: 34.4 g/dL (ref 30.0–36.0)
MCV: 92.6 fL (ref 80.0–100.0)
Monocytes Absolute: 0.6 10*3/uL (ref 0.1–1.0)
Monocytes Relative: 7 %
Neutro Abs: 5.1 10*3/uL (ref 1.7–7.7)
Neutrophils Relative %: 60 %
Platelets: 270 10*3/uL (ref 150–400)
RBC: 4.18 MIL/uL (ref 3.87–5.11)
RDW: 11.9 % (ref 11.5–15.5)
WBC: 8.3 10*3/uL (ref 4.0–10.5)
nRBC: 0 % (ref 0.0–0.2)

## 2018-06-02 LAB — BASIC METABOLIC PANEL
ANION GAP: 10 (ref 5–15)
BUN: 9 mg/dL (ref 6–20)
CO2: 26 mmol/L (ref 22–32)
Calcium: 9.4 mg/dL (ref 8.9–10.3)
Chloride: 102 mmol/L (ref 98–111)
Creatinine, Ser: 0.92 mg/dL (ref 0.44–1.00)
GFR calc Af Amer: >60 mL/min (ref >60)
GFR calc non Af Amer: >60 mL/min (ref >60)
GLUCOSE: 94 mg/dL (ref 70–99)
Potassium: 3.7 mmol/L (ref 3.5–5.1)
Sodium: 138 mmol/L (ref 135–145)

## 2018-06-02 MED ORDER — FENTANYL CITRATE (PF) 100 MCG/2ML IJ SOLN: 50.0000 ug | Freq: Once | INTRAMUSCULAR | Status: DC

## 2018-06-02 MED ORDER — IBUPROFEN 600 MG PO TABS
600.0000 mg | ORAL_TABLET | Freq: Four times a day (QID) | ORAL | Status: DC | PRN
  Administered 2018-06-02 – 2018-06-03 (×3): 600 mg via ORAL
  Filled 2018-06-02 (×3): qty 1

## 2018-06-02 MED ORDER — ACETAMINOPHEN 650 MG RE SUPP: 650.0000 mg | Freq: Four times a day (QID) | RECTAL | Status: DC | PRN

## 2018-06-02 MED ORDER — ACETAMINOPHEN 325 MG PO TABS
650.0000 mg | ORAL_TABLET | Freq: Four times a day (QID) | ORAL | Status: DC | PRN
  Administered 2018-06-03: 650 mg via ORAL
  Filled 2018-06-02: qty 2

## 2018-06-02 MED ORDER — FENTANYL CITRATE (PF) 100 MCG/2ML IJ SOLN
INTRAMUSCULAR | Status: AC
  Filled 2018-06-02: qty 2

## 2018-06-02 NOTE — ED Triage Notes (Signed)
Pt to ed with c/o acute onset of vaginal bleeding that started about 1 1/2 hours pta.  Pt states hx of hysterectomy 1 week ago by Dr. Glennon Mac.  Pt also reports right sided abd pain.

## 2018-06-02 NOTE — H&P (Signed)
GYNECOLOGY ADMISSION HISTORY AND PHYSICAL NOTE    Attending Provider: Will Bonnet, MD   JEFFERY GAMMELL [phone] 06/02/2018 9:56 PM    Chief Complaint:   Emily Jensen is a 42 y.o. G62P2002 female who is 11 days post op from a total laparoscopic hysterectomy, bilateral salpingectomy, cystoscopy who presents for new-onset heavy vaginal bleeding.    History of Present Ilness:   The patient presents after getting out of bed today and she felt like she wet herself. When she went to the bathroom she had soaked her clothes with bright red blood. She changed clothes and put on a pad. At the time of her presentation she continued to have active, bright red bleeding. She denies any activity that would have caused her bleeding. She denies fevers, chills, nausea, vomiting. She is voiding normally and has normal bowel movements. She does have some pain in her right side and back that is slightly worse than before.    Past Medical History:  Diagnosis Date  . Breast mass 2014   left breast 12 o'clcok  . Chicken pox   . Genital herpes   . Menometrorrhagia    Past Surgical History:  Procedure Laterality Date  . BREAST BIOPSY Left 10/11/2012   fibroadenoma removed  . CYSTOSCOPY N/A 05/22/2018   Procedure: CYSTOSCOPY;  Surgeon: Will Bonnet, MD;  Location: [facility] ORS;  Service: Gynecology;  Laterality: N/A;  . EYE SURGERY  2008   Lasix  . KNEE SURGERY Left 1992   arthroscopy for Torn meniscus & ACL  . LAPAROSCOPIC HYSTERECTOMY Bilateral 05/22/2018   Procedure: HYSTERECTOMY TOTAL LAPAROSCOPIC BILATERAL SALPINGECTOMY;  Surgeon: Will Bonnet, MD;  Location: [facility] ORS;  Service: Gynecology;  Laterality: Bilateral;  . MOUTH SURGERY  2000   gum graft  . WISDOM TOOTH EXTRACTION  2000   Allergies  Allergen Reactions  . Codeine Palpitations and Other (See Comments)   Prior to Admission medications   Medication Sig Start Date End Date Taking? Authorizing Provider  b complex vitamins  tablet Take 1 tablet by mouth daily.   Yes [provider]  cholecalciferol (VITAMIN D) 1000 units tablet Take 1,000 Units by mouth daily.   Yes [provider]  ibuprofen (ADVIL,MOTRIN) 600 MG tablet Take 1 tablet (600 mg total) by mouth every 6 (six) hours as needed for mild pain. Patient taking differently: Take 400 mg by mouth every 6 (six) hours as needed for mild pain.  05/22/18  Yes Will Bonnet, MD  Inulin (FIBER CHOICE PO) Take 1 tablet by mouth daily.   Yes [provider]  ondansetron (ZOFRAN ODT) 8 MG disintegrating tablet Take 1 tablet (8 mg total) by mouth every 8 (eight) hours as needed for nausea or vomiting. Patient not taking: Reported on 06/02/2018 05/22/18   Will Bonnet, MD  oxyCODONE-acetaminophen (PERCOCET) 5-325 MG tablet Take 1 tablet by mouth every 4 (four) hours as needed (breakthrough pain). Patient not taking: Reported on 06/02/2018 05/22/18   Will Bonnet, MD    Obstetric History: She is a G27P2002 female s/p SVD x 2.    Social History:  She  reports that she has quit smoking. Her smoking use included cigarettes. She has never used smokeless tobacco. She reports that she drinks alcohol. She reports that she does not use drugs.  Family History:  family history includes Cancer (age of onset: 1) in her maternal grandfather; Diabetes in her paternal grandfather; Glaucoma in her father; Heart disease (age of onset:  52) in her father; Hypertension in her father and mother.   Review of Systems:   Review of Systems  Constitutional: Negative.   HENT: Negative.   Eyes: Negative.   Respiratory: Negative.   Cardiovascular: Negative.   Gastrointestinal: Negative.   Genitourinary: Negative.        See HPI  Musculoskeletal: Negative.   Skin: Negative.   Neurological: Negative.   Psychiatric/Behavioral: Negative.      Objective    BP 113/77 (BP Location: Left Arm)   Pulse 82   Temp 98.5 F (36.9 C) (Oral)   Resp 18   Ht  5\' 8"  (1.727 m)   Wt 89.8 kg   LMP 05/18/2018   SpO2 100%   BMI 30.11 kg/m  Physical Exam  Physical Exam  Constitutional: She is oriented to person, place, and time. She appears well-developed and well-nourished. No distress.  HENT:  Head: Normocephalic and atraumatic.  Eyes: Conjunctivae are normal. No scleral icterus.  Neck: Normal range of motion. Neck supple. No thyromegaly present.  Cardiovascular: Normal rate and regular rhythm. Exam reveals no gallop and no friction rub.  No murmur heard. Pulmonary/Chest: Effort normal and breath sounds normal. No respiratory distress. She has no wheezes. She has no rales.  Abdominal: Soft. Bowel sounds are normal. She exhibits no distension and no mass. There is tenderness (mild ttp right flank). There is no rebound and no guarding.  Genitourinary: Pelvic exam was performed with patient supine. There is no rash, tenderness or lesion on the right labia. There is no rash, tenderness or lesion on the left labia.  Genitourinary Comments: Brown blood in vaginal vault.  Vaginal cuff not well seen, but no obvious active bleeding.  Bimanual confirms no obvious defects to the cuff. She is quite tender to palpation and states that the bimanual exam causes pain in her right back and side.    Musculoskeletal: Normal range of motion. She exhibits no edema.  Lymphadenopathy:    She has no cervical adenopathy.  Neurological: She is alert and oriented to person, place, and time. No cranial nerve deficit.  Skin: Skin is warm and dry. No erythema.  Psychiatric: She has a normal mood and affect. Her behavior is normal. Judgment normal.   Female chaperone present for pelvic exam:     Laboratory Results:   Lab Results  Component Value Date   WBC 8.3 06/02/2018   RBC 4.18 06/02/2018   HGB 13.3 06/02/2018   HCT 38.7 06/02/2018   PLT 270 06/02/2018   NA 138 06/02/2018   K 3.7 06/02/2018   CREATININE 0.92 06/02/2018   Lab Results  Component Value Date    PREGTESTUR NEGATIVE 05/22/2018    Assessment & Plan   Emily Jensen is a 42 y.o. G60P2002  female who is POD#11 s/p TLH, bilateral salpingectomy, cystoscopy, with new-onset vaginal bleeding. We discussed management options. She adamantly does not want to go to the operating room at this time.  I discussed with her that my recommendation would be to take her to the OR for an exam under anesthesia an possible repair of any areas of active bleeding, if found, on the vaginal cuff. We discussed that the bleeding could be from an internal source.  We discussed ways of assessing an internal bleed. Discussed CT scan versus diagnostic laparoscopy at the time of surgery.  Again, she adamantly refuses surgery at this time. A mutual decision was made, based on patient's preference, was to admit her for observation overnight.  If she had continued bleeding, she would need to go to the OR. She agrees to this plan.   Plan:  1.  Admit for observation 2.  Keep NPO in case of need for surgery.   3.  She refuses an IV for MIVF.   4. To OR, if has continued bleeding.  H&H are essentially stable from prior.    Interval update: I visited the patient in her room about 3 hours after admission. She has walked around the hospital and has not had any further bleeding. Any pain or discomfort she has had has improved. She states that she feels like her usual self.  Will continue to monitor overnight.  Prentice Docker, MD 06/02/2018 9:56 PM

## 2018-06-02 NOTE — ED Provider Notes (Signed)
[facility] Emergency Department Provider Note   ____________________________________________   I have reviewed the triage vital signs and the nursing notes.   HISTORY  Chief Complaint Vaginal Bleeding   History limited by: Not Limited   HPI Emily Jensen is a 42 y.o. female who presents to the emergency department today because of concerns for vaginal bleeding in the setting of recent transabdominal hysterectomy.  The patient states that right after the surgery she had some bleeding and then it stopped until today.  She noticed it when she went from sitting to standing up felt at first that she might of urinated on herself but then noticed that it was blood.  Since then she has put on one pad and continues to feel some bleeding.  Patient denies any significant change in pain.  She has had some residual right-sided pain after the operation.  Patient denies any chest pain or shortness of breath.   Per medical record review patient has a history of recent hysterectomy  Past Medical History:  Diagnosis Date  . Breast mass 2014   left breast 12 o'clcok  . Chicken pox   . Genital herpes   . Menometrorrhagia     Patient Active Problem List   Diagnosis Date Noted  . Fibroid uterus 12/13/2017  . Viral upper respiratory infection 08/04/2015  . 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 Other fatigue 11/14/2014  . Routine general medical examination at a health care facility 11/14/2014  . Menorrhagia with irregular cycle 09/27/2014  . Costochondritis 05/27/2014  . Breast cyst 10/28/2013  . Dizziness 07/24/2013  . Mood swings 07/23/2013    Past Surgical History:  Procedure Laterality Date  . BREAST BIOPSY Left 10/11/2012   fibroadenoma removed  . CYSTOSCOPY N/A 05/22/2018   Procedure: CYSTOSCOPY;  Surgeon: Will Bonnet, MD;  Location: [facility] ORS;  Service: Gynecology;  Laterality: N/A;  . EYE SURGERY  2008   Lasix  . KNEE SURGERY Left 1992    arthroscopy for Torn meniscus & ACL  . LAPAROSCOPIC HYSTERECTOMY Bilateral 05/22/2018   Procedure: HYSTERECTOMY TOTAL LAPAROSCOPIC BILATERAL SALPINGECTOMY;  Surgeon: Will Bonnet, MD;  Location: [facility] ORS;  Service: Gynecology;  Laterality: Bilateral;  . MOUTH SURGERY  2000   gum graft  . [place]    Prior to Admission medications   Medication Sig Start Date End Date Taking? Authorizing Provider  b complex vitamins tablet Take 1 tablet by mouth daily.    [provider]  cholecalciferol (VITAMIN D) 1000 units tablet Take 1,000 Units by mouth daily.    [provider]  Hydrocortisone (PREPARATION H EX) Place 1 application rectally daily as needed (hemorrhoids).    [provider]  ibuprofen (ADVIL,MOTRIN) 600 MG tablet Take 1 tablet (600 mg total) by mouth every 6 (six) hours as needed for mild pain. 05/22/18   Will Bonnet, MD  Inulin (FIBER CHOICE PO) Take 1 tablet by mouth daily.    [provider]  ondansetron (ZOFRAN ODT) 8 MG disintegrating tablet Take 1 tablet (8 mg total) by mouth every 8 (eight) hours as needed for nausea or vomiting. 05/22/18   Will Bonnet, MD  oxyCODONE-acetaminophen (PERCOCET) 5-325 MG tablet Take 1 tablet by mouth every 4 (four) hours as needed (breakthrough pain). 05/22/18   Will Bonnet, MD    Allergies Codeine  Family History  Problem Relation Age of Onset  . Hypertension Mother   . Heart disease Father 37  CAD - Quadruple bypass/ MI x 2  . Hypertension Father   . Glaucoma Father   . Diabetes Paternal Grandfather   . Cancer Maternal Grandfather 51       Colon Cancer    Social History Social History   Tobacco Use  . Smoking status: Former Smoker    Types: Cigarettes  . Smokeless tobacco: Never Used  Substance Use Topics  . Alcohol use: Yes    Comment: occasionally - maybe 1 drink/week  . Drug use: No    Review of Systems Constitutional: No  fever/chills Eyes: No visual changes. ENT: No sore throat. Cardiovascular: Denies chest pain. Respiratory: Denies shortness of breath. Gastrointestinal: Positive for right lower abdominal pain Genitourinary: Positive for vaginal bleeding Musculoskeletal: Negative for back pain. Skin: Negative for rash. Neurological: Negative for headaches, focal weakness or numbness.  ____________________________________________   PHYSICAL EXAM:  VITAL SIGNS: ED Triage Vitals [06/02/18 1325]  Enc Vitals Group     BP 139/89     Pulse Rate 88     Resp 18     Temp 98.1 F (36.7 C)     Temp Source Oral     SpO2 100 %     Weight 198 lb (89.8 kg)     Height 5\' 8"  (1.727 m)     Head Circumference      Peak Flow      Pain Score 4   Constitutional: Alert and oriented.  Eyes: Conjunctivae are normal.  ENT      Head: Normocephalic and atraumatic.      Nose: No congestion/rhinnorhea.      Mouth/Throat: Mucous membranes are moist.      Neck: No stridor. Hematological/Lymphatic/Immunilogical: No cervical lymphadenopathy. Cardiovascular: Normal rate, regular rhythm.  No murmurs, rubs, or gallops.  Respiratory: Normal respiratory effort without tachypnea nor retractions. Breath sounds are clear and equal bilaterally. No wheezes/rales/rhonchi. Gastrointestinal: Soft and non tender. No rebound. No guarding.  Genitourinary: Deferred Musculoskeletal: Normal range of motion in all extremities. No lower extremity edema. Neurologic:  Normal speech and language. No gross focal neurologic deficits are appreciated.  Skin:  Skin is warm, dry and intact. No rash noted. Psychiatric: Mood and affect are normal. Speech and behavior are normal. Patient exhibits appropriate insight and judgment.  ____________________________________________    LABS (pertinent positives/negatives)  CBC wnl BMP wnl  ____________________________________________   EKG  None  ____________________________________________     RADIOLOGY  None  ____________________________________________   PROCEDURES  Procedures  ____________________________________________   INITIAL IMPRESSION / ASSESSMENT AND PLAN / ED COURSE  Pertinent labs & imaging results that were available during my care of the patient were reviewed by me and considered in my medical decision making (see chart for details).   Patient presented to the emergency department today because of concerns for vaginal bleeding after a recent hysterectomy.  Discussed with Dr. Glennon Mac her OB/GYN doctor.  He did come evaluate the patient.  Will plan on admission for other work-up and evaluation. Patient's blood work and vital signs without findings for concerning large volume blood loss.   _______________________________________   FINAL CLINICAL IMPRESSION(S) / ED DIAGNOSES  Final diagnoses:  Vaginal bleeding     Note: This dictation was prepared with Dragon dictation. Any transcriptional errors that result from this process are unintentional     Nance Pear, MD 06/02/18 1739

## 2018-06-03 LAB — CBC
HCT: 35.9 % — ABNORMAL LOW (ref 36.0–46.0)
Hemoglobin: 12.4 g/dL (ref 12.0–15.0)
MCH: 32 pg (ref 26.0–34.0)
MCHC: 34.5 g/dL (ref 30.0–36.0)
MCV: 92.5 fL (ref 80.0–100.0)
PLATELETS: 232 10*3/uL (ref 150–400)
RBC: 3.88 MIL/uL (ref 3.87–5.11)
RDW: 12 % (ref 11.5–15.5)
WBC: 15.2 10*3/uL — ABNORMAL HIGH (ref 4.0–10.5)
nRBC: 0 % (ref 0.0–0.2)

## 2018-06-03 NOTE — Progress Notes (Signed)
Pt discharged home.  Discharge instructions, prescriptions and follow up appointment given to and reviewed with pt.  Pt verbalized understanding.  Escorted by auxillary. 

## 2018-06-03 NOTE — Discharge Summary (Signed)
DC Summary Discharge Summary   Patient ID: Emily Jensen [id number] 42 y.o. 05-Nov-1975  Admit date: 06/02/2018  Discharge date: 06/03/2018  Principal Diagnoses:  Patient Active Problem List   Diagnosis Date Noted  . Postoperative vaginal bleeding following genitourinary procedure 06/02/2018     Secondary Diagnoses:  none  Procedures performed during the hospitalization:  Admission for observation for heavy vaginal bleeding for need for surgical intervention  HPI: 42 y.o. [id number] female who is post op day #11 s/p total laparoscopic hysterectomy, bilateral salpingectomy, cystoscopy.  The patient presents after getting out of bed today and she felt like she wet herself. When she went to the bathroom she had soaked her clothes with bright red blood. She changed clothes and put on a pad. At the time of her presentation she continued to have active, bright red bleeding. She denies any activity that would have caused her bleeding. She denies fevers, chills, nausea, vomiting. She is voiding normally and has normal bowel movements. She does have some pain in her right side and back that is slightly worse than before.    Past Medical History:  Diagnosis Date  . Breast mass 2014   left breast 12 o'clcok  . Chicken pox   . Genital herpes   . Menometrorrhagia     Past Surgical History:  Procedure Laterality Date  . BREAST BIOPSY Left 10/11/2012   fibroadenoma removed  . CYSTOSCOPY N/A 05/22/2018   Procedure: CYSTOSCOPY;  Surgeon: Will Bonnet, MD;  Location: [facility] ORS;  Service: Gynecology;  Laterality: N/A;  . EYE SURGERY  2008   Lasix  . KNEE SURGERY Left 1992   arthroscopy for Torn meniscus & ACL  . LAPAROSCOPIC HYSTERECTOMY Bilateral 05/22/2018   Procedure: HYSTERECTOMY TOTAL LAPAROSCOPIC BILATERAL SALPINGECTOMY;  Surgeon: Will Bonnet, MD;  Location: [facility] ORS;  Service: Gynecology;  Laterality: Bilateral;  . MOUTH SURGERY  2000   gum graft  . WISDOM TOOTH EXTRACTION   2000    Allergies  Allergen Reactions  . Codeine Palpitations and Other (See Comments)    Social History   Tobacco Use  . Smoking status: Former Smoker    Types: Cigarettes  . Smokeless tobacco: Never Used  Substance Use Topics  . Alcohol use: Yes    Comment: occasionally - maybe 1 drink/week  . Drug use: No    Family History  Problem Relation Age of Onset  . Hypertension Mother   . Heart disease Father 52       CAD - Quadruple bypass/ MI x 2  . Hypertension Father   . Glaucoma Father   . Diabetes Paternal Grandfather   . Cancer Maternal Grandfather 25       Colon Cancer    Hospital Course:  The patient was admitted for observation of vaginal bleeding.  After her admission she had minimal bleeding. She noted only a total of what she was describe as a quarter-sized amount of old, dark-colored blood over the course of yesterday evening, overnight, and this morning. She was afebrile, she was tolerating PO, she had no pain issues, she was voiding spontaneously.  Her presenting problem was essentially resolved without surgical intervention. She is discharged in  Improved condition with strict precautions to notify me should she have further bright-red blood.    Discharge Exam: BP 102/68 (BP Location: Left Arm)   Pulse 78   Temp 98 F (36.7 C)   Resp 18   Ht 5\' 8"  (1.727 m)   Wt 89.8 kg  LMP 05/18/2018   SpO2 99%   BMI 30.11 kg/m  General  no apparent distress   CV  RRR   Pulmonary  clear to ausculatation bllaterally   Abdomen  Bowel sounds: present  Incisions: clean, dry, intact  Non-distended, non-tender  Extremities  no edema, symmetric   Condition at Discharge: Stable  Complications affecting treatment: None  Discharge Medications:  Allergies as of 06/03/2018      Reactions   Codeine Palpitations, Other (See Comments)      Medication List    STOP taking these medications   ondansetron 8 MG disintegrating tablet Commonly known as:  ZOFRAN-ODT    oxyCODONE-acetaminophen 5-325 MG tablet Commonly known as:  PERCOCET/ROXICET     TAKE these medications   b complex vitamins tablet Take 1 tablet by mouth daily.   cholecalciferol 1000 units tablet Commonly known as:  VITAMIN D Take 1,000 Units by mouth daily.   FIBER CHOICE PO Take 1 tablet by mouth daily.   ibuprofen 600 MG tablet Commonly known as:  ADVIL,MOTRIN Take 1 tablet (600 mg total) by mouth every 6 (six) hours as needed for mild pain. What changed:  how much to take      Follow-up arrangements:  Follow-up Information    Will Bonnet, MD. Schedule an appointment as soon as possible for a visit in 3 day(s).   Specialty:  Obstetrics and Gynecology Why:  Post op follow up Contact information: [address] [place] [address] [phone]          Discharge Disposition: Home to self care in improved condition  Signed: Prentice Docker, MD 06/03/2018 10:32 AM

## 2018-06-06 ENCOUNTER — Encounter: Payer: Self-pay | Admitting: Obstetrics and Gynecology

## 2018-06-06 ENCOUNTER — Ambulatory Visit (INDEPENDENT_AMBULATORY_CARE_PROVIDER_SITE_OTHER): Payer: BC Managed Care – PPO | Admitting: Obstetrics and Gynecology

## 2018-06-06 VITALS — BP 124/74 | Wt 194.0 lb

## 2018-06-06 DIAGNOSIS — N760 Acute vaginitis: Secondary | ICD-10-CM

## 2018-06-06 MED ORDER — AMOXICILLIN-POT CLAVULANATE 875-125 MG PO TABS: 1.0000 | ORAL_TABLET | Freq: Two times a day (BID) | ORAL | 0 refills | Status: AC

## 2018-06-06 NOTE — Progress Notes (Signed)
   Postoperative Follow-up Patient presents post op from TLH/BS/Cysto 2 weeks ago for abnormal uterine bleeding.  Subjective: Patient presents with another "gush" of blood about 1.5 hours ago.  She was admitted for observation 4 days ago due to a gush of blood and her bleeding stopped. She has been afebrile  And hemodynamically stable.  She did not want to undergo a CT scan or exam under anesthesia 4 days ago.  Upon presentation today, her bleeding has tapered off again. She denies having a fever in the interim. She did have a temperature to 99.73F three nights ago.  But, she has taken no ibuprofen or tylenol since that time and has been afebrile.  She has mild right-sided and right back pain.  She has no issues with her incisions.   Objective: Vitals:   06/06/18 1601  BP: 124/74   Vital Signs: BP 124/74   Wt 194 lb (88 kg)   LMP 05/18/2018   BMI 29.50 kg/m  Constitutional: Well nourished, well developed female in no acute distress.  HEENT: normal Skin: Warm and dry.  Extremity: no edema  Abdomen: Soft, non-tender, normal bowel sounds; no bruits, organomegaly or masses. clean, dry, intact and no erythema, induration, warmth, or tenderness at the incision sites  Pelvic exam: (female chaperone present) EGBUS: within normal limits Vagina: vaginal cuff appears intact with no visible dehiscence.  There is what appears to be a small defect in the vaginal mucosa with underlying tissue visible. It is strictly the mucosal layer that appears to be not intact.  There is no active bleeding.  This is confirmed by a bimanual with mild tenderness along the cuff with induration noted.  Bimanual does confirm no defects along the cuff line.     Female chaperone present for pelvic exam:   Assessment: 42 y.o. s/p TLH/BS/Cysto 2 weeks ago with an apparent vaginal cuff cellulitis   Plan: We discussed that the diagnosis of cellulitis does explain some of the symptoms, but not necessarily the pattern of  bleeding. We did discuss that she has been afebrile and that the cuff is intact. Ideally, I would like to perform a CT scan. She, however, is afebrile, hemodynamically stable. She did have a bump in her WBC count at the hospital from when she went in to the ER to the following day and a slight drop in her hemoglobin.  Initial plan will be treat for cellulitis. However, if she has another episode of bleeding, will get a contrast CT of her abdomen/pelvis and consider exam under anesthesia to get best visualization.  She voiced understanding of the plan and agreement.  Will treat with Augmentin x 10 days.    She is to follow up in 1 week or sooner, should her symptoms recur.    Prentice Docker, MD 06/06/2018, 4:28 PM

## 2018-06-08 ENCOUNTER — Telehealth: Payer: Self-pay | Admitting: Obstetrics and Gynecology

## 2018-06-08 ENCOUNTER — Encounter: Payer: Self-pay | Admitting: Obstetrics and Gynecology

## 2018-06-08 ENCOUNTER — Telehealth: Payer: Self-pay

## 2018-06-08 ENCOUNTER — Ambulatory Visit: Payer: BC Managed Care – PPO | Admitting: Obstetrics and Gynecology

## 2018-06-08 ENCOUNTER — Ambulatory Visit
Admission: RE | Admit: 2018-06-08 | Discharge: 2018-06-08 | Disposition: A | Discharge status: Home / Self Care | Payer: BC Managed Care – PPO | Source: Ambulatory Visit | Attending: Obstetrics and Gynecology | Admitting: Obstetrics and Gynecology

## 2018-06-08 ENCOUNTER — Ambulatory Visit (INDEPENDENT_AMBULATORY_CARE_PROVIDER_SITE_OTHER): Payer: BC Managed Care – PPO | Admitting: Obstetrics and Gynecology

## 2018-06-08 VITALS — BP 118/62 | Ht 68.0 in | Wt 197.0 lb

## 2018-06-08 DIAGNOSIS — N99820 Postprocedural hemorrhage and hematoma of a genitourinary system organ or structure following a genitourinary system procedure: Secondary | ICD-10-CM

## 2018-06-08 MED ORDER — IOPAMIDOL (ISOVUE-300) INJECTION 61%
100.0000 mL | Freq: Once | INTRAVENOUS | Status: AC | PRN
  Administered 2018-06-08: 100 mL via INTRAVENOUS

## 2018-06-08 NOTE — Telephone Encounter (Signed)
SPoke with patient to relay the CT scan that showed no etiology for her bleeding. Discussed management options moving forward.  They range from close clinical monitoring (I would want to see her again on Monday) to an exam under anesthesia, which I would only recommend if her bleeding continued in a manner (but didn't stop) to last night.   She was still encouraged to go to the ER for heavy bleeding that continues or for any other concerning symptoms. She was encouraged to continue taking the antibiotics.   All questions answered. Prentice Docker, MD, [facility], [facility] 06/08/2018 6:29 PM

## 2018-06-08 NOTE — Telephone Encounter (Signed)
Pt had hyst 12/7; last night spotted/bleed c clots every hour on the hour until 4am.  After hour nurse tried to get her to go to ED but pt didn't want to.  After hour nurse adv to call this am.  [phone]  Adv pt to be seen esp with weekend coming up.  Last night really caused concern for her.  Appt at 1:30 today c SDJ.

## 2018-06-08 NOTE — Progress Notes (Signed)
   Postoperative Follow-up Patient presents post op from TLH/BS/Cysto 3 weeks ago for abnormal uterine bleeding.  Subjective: Patient presents with much more bleeding than in previous episodes. She had multiple gushes of bright-red bleeding beginning in the evening last night and continuing into today. She is not having bleeding today.  She states that doesn't feel well. She denies having a fever ("worst was 99.2-99.69F").  She continues to have normal body function otherwise.   Objective: Vitals:   06/08/18 1330  BP: 118/62   Vital Signs: BP 118/62   Ht 5\' 8"  (1.727 m)   Wt 197 lb (89.4 kg)   LMP 05/18/2018   BMI 29.95 kg/m  Constitutional: Well nourished, well developed female in no acute distress.  HEENT: normal Skin: Warm and dry.  Extremity: no edema  Abdomen: Soft, non-tender, normal bowel sounds; no bruits, organomegaly or masses. clean, dry, intact and no erythema, induration, warmth, or tenderness at the incision sites  Pelvic exam: (female chaperone present) EGBUS: within normal limits Vagina: vaginal cuff appears intact with no visible dehiscence.  There is what appears to be a small defect in the vaginal mucosa with underlying tissue visible. It is strictly the mucosal layer that appears to be not intact.  There is no active bleeding.  The suture line is probed with a small Qtip to confirm no dehiscence.  This is confirmed by a bimanual with mild tenderness along the cuff with induration noted.  Bimanual does confirm no defects along the cuff line.     Female chaperone present for pelvic exam:   Assessment: 42 y.o. s/p TLH/BS/Cysto 3 weeks ago with continued vaginal bleeding.   Plan: Given her ongoing symptoms, I strongly urge her to obtain a abdominal and pelvic CT scan ASAP.  She voiced agreement with this recommendation.  If negative, strongly consider exam under anesthesia versus continued close clinical monitoring and possibly placing monsels solution along the  vaginal cuff.  Continue taking antibiotics for now.   Also, could consider topical estrogen to promote faster regrowth of vaginal tissue in the area where it appears to be uncovered.  This area appears to of no vaginal tissue appears to be very superficially uncovered without an underlying defect.   Prentice Docker, MD 06/08/2018, 1:38 PM

## 2018-06-11 ENCOUNTER — Ambulatory Visit (INDEPENDENT_AMBULATORY_CARE_PROVIDER_SITE_OTHER): Payer: BC Managed Care – PPO | Admitting: Obstetrics and Gynecology

## 2018-06-11 ENCOUNTER — Encounter: Payer: Self-pay | Admitting: Obstetrics and Gynecology

## 2018-06-11 VITALS — BP 110/70 | HR 90 | Ht 68.0 in | Wt 197.0 lb

## 2018-06-11 DIAGNOSIS — N99820 Postprocedural hemorrhage and hematoma of a genitourinary system organ or structure following a genitourinary system procedure: Secondary | ICD-10-CM

## 2018-06-11 NOTE — [place] Notes (Signed)
   Postoperative Follow-up Patient presents post op from TLH/BS/Cysto 3 weeks ago for abnormal uterine bleeding.  Subjective: Patient presents with much less bleeding than in previous episodes. She has had only minor spotting.  She has had no big gushes of bright-red blood. CT scan of abd/pelvis 3 days ago was normal and did not show a collection of fluid or blood or abscess. She states that feel better than on Friday. She denies having a fever.  She continues to have normal body function otherwise.   Objective: Vitals:   06/11/18 1016  BP: 110/70  Pulse: 90   Vital Signs: BP 110/70 (BP Location: Left Arm, Patient Position: Sitting, Cuff Size: Normal)   Pulse 90   Ht 5\' 8"  (1.727 m)   Wt 197 lb (89.4 kg)   LMP 05/18/2018   BMI 29.95 kg/m  Constitutional: Well nourished, well developed female in no acute distress.  HEENT: normal Skin: Warm and dry.  Extremity: no edema  Abdomen: Soft, non-tender, normal bowel sounds; no bruits, organomegaly or masses. clean, dry, intact and no erythema, induration, warmth, or tenderness at the incision sites  Pelvic exam: (female chaperone present) EGBUS: within normal limits Vagina: vaginal cuff appears intact with no visible dehiscence.  There is what appears to be a small defect in the vaginal mucosa with underlying tissue visible. It is strictly the mucosal layer that appears to be not intact.  This area appears to be smaller today with less erythema.  There is no active bleeding.  The suture line is probed with a small Qtip to confirm no dehiscence.  Monsel's solution is applied to continue hemostasis Female chaperone present for pelvic exam:   Assessment: 42 y.o. s/p TLH/BS/Cysto 3 weeks ago with continued vaginal bleeding.   Plan: Continue to monitor closely at home.  Follow up for any bleeding. Otherwise, I will see her in 3 weeks.  Prentice Docker, MD 06/11/2018, 10:26 AM

## 2018-06-13 ENCOUNTER — Ambulatory Visit: Payer: BC Managed Care – PPO | Admitting: Obstetrics and Gynecology

## 2018-07-06 ENCOUNTER — Ambulatory Visit (INDEPENDENT_AMBULATORY_CARE_PROVIDER_SITE_OTHER): Payer: BC Managed Care – PPO | Admitting: Obstetrics and Gynecology

## 2018-07-06 ENCOUNTER — Encounter: Payer: Self-pay | Admitting: Obstetrics and Gynecology

## 2018-07-06 ENCOUNTER — Other Ambulatory Visit: Payer: Self-pay

## 2018-07-06 VITALS — BP 104/64 | HR 70 | Ht 68.0 in | Wt 200.0 lb

## 2018-07-06 DIAGNOSIS — Z09 Encounter for follow-up examination after completed treatment for conditions other than malignant neoplasm: Secondary | ICD-10-CM

## 2018-07-06 DIAGNOSIS — N921 Excessive and frequent menstruation with irregular cycle: Secondary | ICD-10-CM

## 2018-07-06 NOTE — [place] Notes (Signed)
   Postoperative Follow-up Patient presents post op from TLH/BS/Cysto 7 weeks ago for abnormal uterine bleeding.  Subjective: Patient reports marked improvement in her preop symptoms. Eating a regular diet without difficulty. The patient is not having any pain.  Activity: normal activities of daily living.  She has had no vaginal bleeding since her last appointment.   Objective: Vitals:   07/06/18 1631  BP: 104/64  Pulse: 70   Vital Signs: BP 104/64 (BP Location: Left Arm, Patient Position: Sitting, Cuff Size: Normal)   Pulse 70   Ht 5\' 8"  (1.727 m)   Wt 200 lb (90.7 kg)   LMP 05/18/2018   BMI 30.41 kg/m  Constitutional: Well nourished, well developed female in no acute distress.  HEENT: normal Skin: Warm and dry.  Extremity: no edema  Abdomen: Soft, non-tender, normal bowel sounds; no bruits, organomegaly or masses. clean, dry, intact and no erythema, induration, warmth, or tenderness  Pelvic exam: (female chaperone present) is not limited by body habitus EGBUS: within normal limits Vagina: within normal limits and with no blood in the vault  Vaginal cuff: clean, dry, intact, no erythema, induration, warmth, tenderness.  There is an approximately 2 x 3 mm area of granulation tissue treated with silver nitrate.      Assessment: 43 y.o. s/p TLH/BS/Cysto progressing well  Plan: Patient has done well after surgery with no apparent complications.  I have discussed the post-operative course to date, and the expected progress moving forward.  The patient understands what complications to be concerned about.  I will see the patient in routine follow up, or sooner if needed.    Activity plan: no sexual activity for 3 weeks. If have any vaginal bleeding or spotting, return to clinic for repeat treatment of granulation tissue.   Prentice Docker, MD 07/06/2018, 4:41 PM

## 2018-07-31 ENCOUNTER — Telehealth: Payer: Self-pay

## 2018-07-31 NOTE — Telephone Encounter (Signed)
Patient is schedule 08/01/18 at 8:30

## 2018-07-31 NOTE — Telephone Encounter (Signed)
Pt had hyst 53/00FR c several complications.  She had IC 2/2&3 and is now bleeding again.  [phone]

## 2018-07-31 NOTE — Telephone Encounter (Signed)
Patient states she had intercourse last Friday (first time since Surgery).  She noticed bright red blood and so they stopped. She had no bleeding on Saturday or Sunday. The attempted intercourse again on Sunday with no bleeding (though she states they were very gentle).  No bleeding Monday. However, yesterday she notes bright red blood when wiping with the tissue in the bathroom. I recommend she come in for evaluation due to possible granulation tissue versus something else.    I asked her to call and be scheduled this week.  IT IS OK TO OVERBOOK OR DOUBLE -BOOK her in the schedule.  Prentice Docker, MD, [facility], [place] 07/31/2018 12:17 PM

## 2018-08-01 ENCOUNTER — Encounter: Payer: Self-pay | Admitting: Obstetrics and Gynecology

## 2018-08-01 ENCOUNTER — Ambulatory Visit (INDEPENDENT_AMBULATORY_CARE_PROVIDER_SITE_OTHER): Payer: BC Managed Care – PPO | Admitting: Obstetrics and Gynecology

## 2018-08-01 VITALS — BP 122/80 | Ht 68.0 in | Wt 200.0 lb

## 2018-08-01 DIAGNOSIS — N99820 Postprocedural hemorrhage and hematoma of a genitourinary system organ or structure following a genitourinary system procedure: Secondary | ICD-10-CM

## 2018-08-01 DIAGNOSIS — Z09 Encounter for follow-up examination after completed treatment for conditions other than malignant neoplasm: Secondary | ICD-10-CM

## 2018-08-01 NOTE — [place] Notes (Signed)
   Postoperative Follow-up Patient presents post op from TLH/BS/Cysto 10 weeks ago for abnormal uterine bleeding.  Subjective: Patient reports bleeding after intercourse over the weekend. Four days ago she had intercourse and noted bright red bleeding after. The bleeding stopped and she had intercourse the next day during and after which she had no bleeding. Then, yesterday she had bright red bleeding again on the tissue. Denies fevers, chills, nausea, vomiting.    Objective: Vitals:   08/01/18 0819  BP: 122/80   Vital Signs: BP 122/80   Ht 5\' 8"  (1.727 m)   Wt 200 lb (90.7 kg)   LMP 05/18/2018   BMI 30.41 kg/m  Constitutional: Well nourished, well developed female in no acute distress.  HEENT: normal Skin: Warm and dry.  Extremity: no edema  Abdomen: Soft, non-tender, normal bowel sounds; no bruits, organomegaly or masses. clean, dry, intact and no erythema, induration, warmth, or tenderness  Pelvic exam: (female chaperone present) is not limited by body habitus EGBUS: within normal limits Vagina: within normal limits and with no blood in the vault  Vaginal cuff: clean, dry, intact, no erythema, induration, warmth, tenderness.  There is an approximately 5 mm area of granulation tissue treated with silver nitrate.      Assessment: 43 y.o. s/p TLH/BS/Cysto with vaginal bleeding due to granulation tissue.  Treated today.   Plan: Weekly treatment with silver nitrate until the granulation tissue is gone.   Prentice Docker, MD 08/01/2018, 8:38 AM

## 2018-08-10 ENCOUNTER — Ambulatory Visit: Payer: BC Managed Care – PPO | Admitting: Obstetrics and Gynecology

## 2018-08-21 ENCOUNTER — Ambulatory Visit (INDEPENDENT_AMBULATORY_CARE_PROVIDER_SITE_OTHER): Payer: BC Managed Care – PPO | Admitting: Obstetrics and Gynecology

## 2018-08-21 ENCOUNTER — Encounter: Payer: Self-pay | Admitting: Obstetrics and Gynecology

## 2018-08-21 VITALS — BP 122/74 | Ht 68.0 in | Wt 200.0 lb

## 2018-08-21 DIAGNOSIS — N99820 Postprocedural hemorrhage and hematoma of a genitourinary system organ or structure following a genitourinary system procedure: Secondary | ICD-10-CM

## 2018-08-21 NOTE — Progress Notes (Signed)
   Postoperative Follow-up Patient presents post op from TLH/BS/Cysto 12 weeks ago for abnormal uterine bleeding.  Subjective: Patient reports bleeding after intercourse over the weekend again. The bleeding stopped and she had intercourse the next day during and after which she had no bleeding. Then, yesterday she had bright red bleeding again on the tissue. Denies fevers, chills, nausea, vomiting.    Objective: Vitals:   08/21/18 1415  BP: 122/74   Vital Signs: BP 122/74   Ht 5\' 8"  (1.727 m)   Wt 200 lb (90.7 kg)   LMP 05/18/2018   BMI 30.41 kg/m  Constitutional: Well nourished, well developed female in no acute distress.  HEENT: normal Skin: Warm and dry.  Extremity: no edema  Abdomen: Soft, non-tender, normal bowel sounds; no bruits, organomegaly or masses. clean, dry, intact and no erythema, induration, warmth, or tenderness  Pelvic exam: (female chaperone present) is not limited by body habitus EGBUS: within normal limits Vagina: within normal limits and with no blood in the vault  Vaginal cuff: clean, dry, intact, no erythema, induration, warmth, tenderness.  There is an approximately 8 mm area of granulation tissue treated with silver nitrate and Monsel's solution.      Assessment: 43 y.o. s/p TLH/BS/Cysto with vaginal bleeding due to granulation tissue.  Treated today.   Plan: Weekly treatment with silver nitrate until the granulation tissue is gone.   Prentice Docker, MD 08/21/2018, 2:25 PM

## 2018-08-28 ENCOUNTER — Ambulatory Visit (INDEPENDENT_AMBULATORY_CARE_PROVIDER_SITE_OTHER): Payer: BC Managed Care – PPO | Admitting: Obstetrics and Gynecology

## 2018-08-28 ENCOUNTER — Encounter: Payer: Self-pay | Admitting: Obstetrics and Gynecology

## 2018-08-28 VITALS — BP 114/64 | HR 88 | Wt 202.0 lb

## 2018-08-28 DIAGNOSIS — N99820 Postprocedural hemorrhage and hematoma of a genitourinary system organ or structure following a genitourinary system procedure: Secondary | ICD-10-CM | POA: Diagnosis not present

## 2018-08-28 NOTE — Progress Notes (Signed)
   Postoperative Follow-up Patient presents post op from TLH/BS/Cysto 13 weeks ago for abnormal uterine bleeding.  Subjective: Patient reports bleeding after intercourse over the weekend again. The bleeding stopped several days after her last appointment. Since that time she has had no bleeding and no intercourse. The bleeding she had was light, but bright red and only noted on the tissue when wipingDenies fevers, chills, nausea, vomiting.    Objective: Vitals:   08/28/18 1344  BP: 114/64  Pulse: 88   Vital Signs: BP 114/64   Pulse 88   Wt 202 lb (91.6 kg)   LMP 05/18/2018   BMI 30.71 kg/m  Constitutional: Well nourished, well developed female in no acute distress.  HEENT: normal Skin: Warm and dry.  Extremity: no edema  Abdomen: Soft, non-tender, normal bowel sounds; no bruits, organomegaly or masses. clean, dry, intact and no erythema, induration, warmth, or tenderness  Pelvic exam: (female chaperone present) is not limited by body habitus EGBUS: within normal limits Vagina: within normal limits and with no blood in the vault  Vaginal cuff: clean, dry, intact, no erythema, induration, warmth, tenderness.  There is an approximately 7 mm area of granulation tissue treated with silver nitrate and Monsel's solution.      Assessment: 43 y.o. s/p TLH/BS/Cysto with vaginal bleeding due to granulation tissue.  Treated today.   Plan: Weekly treatment with silver nitrate until the granulation tissue is gone.   Prentice Docker, MD 08/28/2018, 2:15 PM

## 2018-09-05 ENCOUNTER — Other Ambulatory Visit: Payer: Self-pay

## 2018-09-05 ENCOUNTER — Ambulatory Visit (INDEPENDENT_AMBULATORY_CARE_PROVIDER_SITE_OTHER): Payer: BC Managed Care – PPO | Admitting: Obstetrics and Gynecology

## 2018-09-05 VITALS — BP 120/74 | Ht 68.0 in | Wt 202.0 lb

## 2018-09-05 DIAGNOSIS — N99820 Postprocedural hemorrhage and hematoma of a genitourinary system organ or structure following a genitourinary system procedure: Secondary | ICD-10-CM

## 2018-09-05 DIAGNOSIS — Z09 Encounter for follow-up examination after completed treatment for conditions other than malignant neoplasm: Secondary | ICD-10-CM | POA: Diagnosis not present

## 2018-09-05 NOTE — Progress Notes (Signed)
   Postoperative Follow-up Patient presents post op from TLH/BS/Cysto 14 weeks ago for abnormal uterine bleeding.  Subjective: The bleeding stopped several days after her last appointment. Since that time she has had no bleeding and no intercourse. The bleeding she had was light, but bright red and only noted on the tissue when wipingDenies fevers, chills, nausea, vomiting.    Objective: Vitals:   09/05/18 1016  BP: 120/74   Vital Signs: BP 120/74   Ht 5\' 8"  (1.727 m)   Wt 202 lb (91.6 kg)   LMP 05/18/2018   BMI 30.71 kg/m  Constitutional: Well nourished, well developed female in no acute distress.  HEENT: normal Skin: Warm and dry.  Extremity: no edema  Abdomen: Soft, non-tender, normal bowel sounds; no bruits, organomegaly or masses. clean, dry, intact and no erythema, induration, warmth, or tenderness  Pelvic exam: (female chaperone present) is not limited by body habitus EGBUS: within normal limits Vagina: within normal limits and with no blood in the vault  Vaginal cuff: clean, dry, intact, no erythema, induration, warmth, tenderness.  There is an approximately 5 mm area of granulation tissue treated with silver nitrate and Monsel's solution.      Assessment: 43 y.o. s/p TLH/BS/Cysto with vaginal bleeding due to granulation tissue.  Treated today.   Plan: Weekly treatment with silver nitrate until the granulation tissue is gone.   Prentice Docker, MD 09/05/2018, 10:34 AM

## 2018-09-14 ENCOUNTER — Ambulatory Visit (INDEPENDENT_AMBULATORY_CARE_PROVIDER_SITE_OTHER): Payer: BC Managed Care – PPO | Admitting: Obstetrics and Gynecology

## 2018-09-14 ENCOUNTER — Other Ambulatory Visit: Payer: Self-pay

## 2018-09-14 ENCOUNTER — Encounter: Payer: Self-pay | Admitting: Obstetrics and Gynecology

## 2018-09-14 VITALS — BP 126/84 | Ht 68.0 in | Wt 200.0 lb

## 2018-09-14 DIAGNOSIS — N99820 Postprocedural hemorrhage and hematoma of a genitourinary system organ or structure following a genitourinary system procedure: Secondary | ICD-10-CM | POA: Diagnosis not present

## 2018-09-14 NOTE — Progress Notes (Signed)
   Postoperative Follow-up Patient presents post op from TLH/BS/Cysto 15 weeks ago for abnormal uterine bleeding.  Subjective: Since her last visit she has had only a small amount of yellow-ish/orange discharge that lasted a couple of days. She did have intercourse, but only very light spotting after. No bleeding today.  Denies fevers, chills, nausea, vomiting.    Objective: Vitals:   09/14/18 0829  BP: 126/84   Vital Signs: BP 126/84   Ht 5\' 8"  (1.727 m)   Wt 200 lb (90.7 kg)   LMP 05/18/2018   BMI 30.41 kg/m  Constitutional: Well nourished, well developed female in no acute distress.  HEENT: normal Skin: Warm and dry.  Extremity: no edema  Abdomen: Soft, non-tender, normal bowel sounds; no bruits, organomegaly or masses. clean, dry, intact and no erythema, induration, warmth, or tenderness  Pelvic exam: (female chaperone present) is not limited by body habitus EGBUS: within normal limits Vagina: within normal limits and with no blood in the vault  Vaginal cuff: clean, dry, there is an area approximately 1-1/2 cm in diameter that appears to be separated mucosal tissue.  During exam it was difficult to discern tissue treated with silver nitrate that was stained versus a separation of mucosal tissue.  No obvious protrusions of granulation tissue noted today.  The edges of the apparent mucosa were treated with silver nitrate and Monsel solution.  Assessment: 43 y.o. s/p TLH/BS/Cysto with vaginal bleeding due to granulation tissue.  Treated today.   Plan: Given improvement in granulation tissue, but concern for separation of mucosa at the cuff, will treat with vaginal estrogen to close the cuff defect.  There does not appear to be communication with the intra-abdominal cavity, rather there appears to be only a slight separation of the mucosa.  It is also possible that this is only stained epithelium.  However, topical vaginal estrogen should treat either situation.  We will have  her follow-up in 3 weeks or so to assess closure of the epithelium versus improvement in exam.  Overall I believe she is improving.  However, I do want to treat any separation of the vaginal cuff, if necessary.  Ideally, if separation of the epithelial layer is present, this would be treated surgically.  However no elective surgeries are being allowed at this time given the current coronavirus concerns and limitations of OR usage.  The patient understands my findings on exam today and voiced agreement with the plan moving forward.  Prentice Docker, MD 09/14/2018, 8:38 AM

## 2018-10-03 ENCOUNTER — Ambulatory Visit: Payer: BC Managed Care – PPO | Admitting: Obstetrics and Gynecology

## 2018-10-03 ENCOUNTER — Ambulatory Visit (INDEPENDENT_AMBULATORY_CARE_PROVIDER_SITE_OTHER): Payer: BC Managed Care – PPO | Admitting: Obstetrics and Gynecology

## 2018-10-03 ENCOUNTER — Encounter: Payer: Self-pay | Admitting: Obstetrics and Gynecology

## 2018-10-03 ENCOUNTER — Other Ambulatory Visit: Payer: Self-pay

## 2018-10-03 VITALS — BP 124/74 | Ht 68.0 in | Wt 201.0 lb

## 2018-10-03 DIAGNOSIS — N99820 Postprocedural hemorrhage and hematoma of a genitourinary system organ or structure following a genitourinary system procedure: Secondary | ICD-10-CM

## 2018-10-03 NOTE — Progress Notes (Signed)
   Postoperative Follow-up Patient presents post op from TLH/BS/Cysto 17 weeks ago for abnormal uterine bleeding.  Subjective: Since her last visit she has had no issues. She has had no intercourse since before her last visit.   Objective: Vitals:   10/03/18 0842  BP: 124/74   Vital Signs: BP 124/74   Ht 5\' 8"  (1.727 m)   Wt 201 lb (91.2 kg)   LMP 05/18/2018   BMI 30.56 kg/m  Constitutional: Well nourished, well developed female in no acute distress.  HEENT: normal Skin: Warm and dry.  Extremity: no edema  Abdomen: Soft, non-tender, normal bowel sounds; no bruits, organomegaly or masses. clean, dry, intact and no erythema, induration, warmth, or tenderness  Pelvic exam: (female chaperone present) is not limited by body habitus EGBUS: within normal limits Vagina: within normal limits and with no blood in the vault  Vaginal cuff: clean, dry, there is an area approximately 1cm in diameter of granulation tissue treated today with silver nitrate.  Assessment: 43 y.o. s/p TLH/BS/Cysto with vaginal bleeding due to granulation tissue.  Treated today.   Plan: It appears that the granulation tissue is still present.  The patient is understandably upset.  She would like to take a break from checking on her issue.  At this point I believe she needs a trip to the operating room to give her definitive treatment.  She has been resistant to going back to the operating room and given the current pandemic situation, I have been resistant to take her.  We discussed that given this is not an emergency situation, it would likely be a couple of months at least before could get her to the operating room.  I discussed having her continue to visit to treat the granulation tissue so that it does not increase in size.  However, she wants to come back in 1 or 2 months or if symptoms acutely worsen.   Prentice Docker, MD 10/03/2018, 8:55 AM

## 2018-10-04 ENCOUNTER — Ambulatory Visit: Payer: BC Managed Care – PPO | Admitting: Obstetrics and Gynecology

## 2018-10-18 ENCOUNTER — Telehealth: Payer: Self-pay | Admitting: Obstetrics and Gynecology

## 2018-10-18 NOTE — Telephone Encounter (Signed)
Discussed the use of topical estrogen for her vaginal cuff granulation tissue.  She agrees to give this a try and I will give her some samples. Follow up in 2 months instead of next week.

## 2018-10-31 ENCOUNTER — Ambulatory Visit: Payer: BC Managed Care – PPO | Admitting: Obstetrics and Gynecology

## 2018-11-14 NOTE — Telephone Encounter (Signed)
Pt coming today to pick up

## 2018-11-14 NOTE — Telephone Encounter (Signed)
I put a sample box on [name]'s desk.  Someone just needs to mark it off the sample list and call the patient and let her know she can come by and pick it up. Thanks!

## 2018-11-14 NOTE — Telephone Encounter (Signed)
Pt is at the end of her third trial box of estrogen cream, can she get another trial box to last to her 6/29 appt, or do you need to call a Rx in? Please advise.

## 2018-12-24 ENCOUNTER — Encounter: Payer: Self-pay | Admitting: Obstetrics and Gynecology

## 2018-12-24 ENCOUNTER — Ambulatory Visit (INDEPENDENT_AMBULATORY_CARE_PROVIDER_SITE_OTHER): Payer: BC Managed Care – PPO | Admitting: Obstetrics and Gynecology

## 2018-12-24 ENCOUNTER — Other Ambulatory Visit: Payer: Self-pay

## 2018-12-24 VITALS — BP 122/80 | Ht 68.0 in | Wt 202.0 lb

## 2018-12-24 DIAGNOSIS — Z9889 Other specified postprocedural states: Secondary | ICD-10-CM

## 2018-12-24 DIAGNOSIS — N99820 Postprocedural hemorrhage and hematoma of a genitourinary system organ or structure following a genitourinary system procedure: Secondary | ICD-10-CM

## 2018-12-24 NOTE — Progress Notes (Signed)
   Postoperative Follow-up Patient presents post op from TLH/BS/Cysto on 05/22/2018 ago for abnormal uterine bleeding.  Subjective: Since her last visit she has had no issues. She has had no intercourse since before her last visit.   Objective: Vitals:   12/24/18 0856  BP: 122/80   Vital Signs: BP 122/80   Ht 5\' 8"  (1.727 m)   Wt 202 lb (91.6 kg)   LMP 05/18/2018   BMI 30.71 kg/m  Constitutional: Well nourished, well developed female in no acute distress.  HEENT: normal Skin: Warm and dry.  Extremity: no edema  Abdomen: Soft, non-tender, normal bowel sounds; no bruits, organomegaly or masses. clean, dry, intact and no erythema, induration, warmth, or tenderness  Pelvic exam: (female chaperone present) is not limited by body habitus EGBUS: within normal limits Vagina: within normal limits and with no blood in the vault  Vaginal cuff: clean, dry, intact. The entire area is epithelialized without any visible granulation tissue.  Assessment: 43 y.o. s/p TLH/BS/Cysto with granulation tissue - Resolved  Plan: Follow up for routine follow up only.    Prentice Docker, MD 12/24/2018, 9:19 AM

## 2020-01-28 ENCOUNTER — Other Ambulatory Visit: Payer: Self-pay

## 2020-01-28 ENCOUNTER — Ambulatory Visit: Payer: Self-pay | Admitting: Physician Assistant

## 2020-01-28 DIAGNOSIS — Z113 Encounter for screening for infections with a predominantly sexual mode of transmission: Secondary | ICD-10-CM

## 2020-01-28 DIAGNOSIS — B009 Herpesviral infection, unspecified: Secondary | ICD-10-CM

## 2020-01-28 LAB — WET PREP FOR TRICH, YEAST, CLUE
Trichomonas Exam: NEGATIVE
Yeast Exam: NEGATIVE

## 2020-01-28 NOTE — Progress Notes (Signed)
[employer] results reviewed. Per standing orders no treatment indicated. 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 Provider C. [place], [place] notified and into room to discuss above with patient. Hal Morales, RN

## 2020-01-29 ENCOUNTER — Encounter: Payer: Self-pay | Admitting: Physician Assistant

## 2020-01-29 MED ORDER — ACYCLOVIR 800 MG PO TABS: 800.0000 mg | ORAL_TABLET | Freq: Every day | ORAL | 12 refills | Status: AC

## 2020-01-29 MED ORDER — ACYCLOVIR 400 MG PO TABS: 800.0000 mg | ORAL_TABLET | Freq: Every day | ORAL | 12 refills | Status: DC

## 2020-01-29 NOTE — Progress Notes (Signed)
  [facility] Department STI clinic/screening visit  Subjective:  Emily Jensen is a 44 y.o. female being seen today for an STI screening visit. The patient reports they do not have symptoms.  Patient reports that they do not desire a pregnancy in the next year.   They reported they are not interested in discussing contraception today.  Patient's last menstrual period was 05/18/2018.   Patient has the following medical conditions:   Patient Active Problem List   Diagnosis Date Noted  . Postoperative vaginal bleeding following genitourinary procedure 06/02/2018  . Fibroid uterus 12/13/2017  . Viral upper respiratory infection 08/04/2015  . 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 Other fatigue 11/14/2014  . Routine general medical examination at a health care facility 11/14/2014  . Menorrhagia with irregular cycle 09/27/2014  . Costochondritis 05/27/2014  . Breast cyst 10/28/2013  . Dizziness 07/24/2013  . Mood swings 07/23/2013    Chief Complaint  Patient presents with  . SEXUALLY TRANSMITTED DISEASE    screening    HPI  Patient reports that she is not having any symptoms but would like a screening today.  States that she does not have any chronic conditions or take regular medicines.  States that she had a Hysterectomy in 2019 and still has her ovaries.  Last HIV testing was 2-3 years ago and last pap was in 2019.  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  See flowsheet for further details and programmatic requirements.    The following portions of the patient's history were reviewed and updated as appropriate: allergies, current medications, past medical history, past social history, past surgical history and problem list.  Objective:  There were no vitals filed for this visit.  Physical Exam Constitutional:      General: She is not in acute distress.    Appearance: Normal appearance.  HENT:     Head: Normocephalic and atraumatic.  Eyes:     Conjunctiva/sclera:  Conjunctivae normal.  Pulmonary:     Effort: Pulmonary effort is normal.  Neurological:     Mental Status: She is alert and oriented to person, place, and time.  Psychiatric:        Mood and Affect: Mood normal.        Behavior: Behavior normal.        Thought Content: Thought content normal.        Judgment: Judgment normal.    Patient desires to self-collect vaginal samples for GC/Chlamydia and wet mount today.  Assessment and Plan:  Emily Jensen is a 44 y.o. female presenting to the [facility] for STI screening  1. Screening for STD (sexually transmitted disease) Patient into clinic without symptoms. Rec condoms with all sex. Await test results.  Counseled that RN will call if needs to RTC for treatment once results are back.  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 Counseled patient re:  HSV- dz, transmission, cyclic nature and tx options. Rx for Acyclovir 800 mg #30 1 po daily with refills for 1 year written and given to patient. - acyclovir (ZOVIRAX) 800 mg tablet; take 1 tablet daily with refills 12.  No follow-ups on file.  No future appointments.  Jerene Dilling, PA

## 2020-02-02 LAB — GONOCOCCUS CULTURE

## 2020-05-28 ENCOUNTER — Other Ambulatory Visit: Payer: Self-pay | Admitting: Internal Medicine

## 2020-05-28 DIAGNOSIS — Z1231 Encounter for screening mammogram for malignant neoplasm of breast: Secondary | ICD-10-CM

## 2021-02-08 ENCOUNTER — Other Ambulatory Visit: Payer: Self-pay

## 2021-02-08 ENCOUNTER — Ambulatory Visit (INDEPENDENT_AMBULATORY_CARE_PROVIDER_SITE_OTHER): Payer: BC Managed Care – PPO | Admitting: Urology

## 2021-02-08 ENCOUNTER — Encounter: Payer: Self-pay | Admitting: Urology

## 2021-02-08 VITALS — BP 123/81 | HR 88 | Ht 68.0 in | Wt 192.0 lb

## 2021-02-08 DIAGNOSIS — R3121 Asymptomatic microscopic hematuria: Secondary | ICD-10-CM

## 2021-02-08 DIAGNOSIS — R310 Gross hematuria: Secondary | ICD-10-CM

## 2021-02-08 LAB — URINALYSIS, COMPLETE
Bilirubin, UA: NEGATIVE
Glucose, UA: NEGATIVE
Ketones, UA: NEGATIVE
Leukocytes,UA: NEGATIVE
Nitrite, UA: NEGATIVE
Protein,UA: NEGATIVE
Specific Gravity, UA: 1.01 (ref 1.005–1.030)
Urobilinogen, Ur: 0.2 mg/dL (ref 0.2–1.0)
pH, UA: 6 (ref 5.0–7.5)

## 2021-02-08 LAB — MICROSCOPIC EXAMINATION: Bacteria, UA: NONE SEEN

## 2021-02-08 NOTE — Progress Notes (Signed)
02/08/21 1:24 PM   Emily Jensen 1976-06-10 [id number]  CC: Asymptomatic microscopic hematuria  HPI: I saw Emily Jensen for asymptomatic microscopic hematuria.  She has had 2 urine samples since at least 2018 with 4-10 RBCs.  She denies recurrent infections or other urinary problems.  She had some mild dysuria the last couple days that is resolving.  She denies any gross hematuria.  No smoking history, no other carcinogenic exposures.  No family history of bladder or kidney cancer.  She had a CT scan performed in December 2019 around a year after her first episode of microscopic hematuria that showed no urologic abnormalities.  Urinalysis today with 3-10 RBCs, otherwise benign   PMH: Past Medical History:  Diagnosis Date   Breast mass 2014   left breast 12 [id number]'clcok   Chicken pox    Genital herpes    Menometrorrhagia     Surgical History: Past Surgical History:  Procedure Laterality Date   BREAST BIOPSY Left 10/11/2012   fibroadenoma removed   CYSTOSCOPY N/A 05/22/2018   Procedure: CYSTOSCOPY;  Surgeon: Will Bonnet, MD;  Location: [facility] ORS;  Service: Gynecology;  Laterality: N/A;   EYE SURGERY  2008   Lasix   KNEE SURGERY Left 1992   arthroscopy for Torn meniscus & ACL   LAPAROSCOPIC HYSTERECTOMY Bilateral 05/22/2018   Procedure: HYSTERECTOMY TOTAL LAPAROSCOPIC BILATERAL SALPINGECTOMY;  Surgeon: Will Bonnet, MD;  Location: [facility] ORS;  Service: Gynecology;  Laterality: Bilateral;   MOUTH SURGERY  2000   gum graft   WISDOM TOOTH EXTRACTION  2000     Family History: Family History  Problem Relation Age of Onset   Hypertension Mother    Heart disease Father 39       CAD - Quadruple bypass/ MI x 2   Hypertension Father    Glaucoma Father    Diabetes Paternal Grandfather    Cancer Maternal Grandfather 87       Colon Cancer    Social History:  reports that she has quit smoking. Her smoking use included cigarettes. She has never used smokeless tobacco. She  reports current alcohol use. She reports that she does not use drugs.  Physical Exam: BP 123/81   Pulse 88   Ht '5\' 8"'$  (1.727 m)   Wt 192 lb (87.1 kg)   LMP 05/18/2018   BMI 29.19 kg/m    Constitutional:  Alert and oriented, No acute distress. Cardiovascular: No clubbing, cyanosis, or edema. Respiratory: Normal respiratory effort, no increased work of breathing. GI: Abdomen is soft, nontender, nondistended, no abdominal masses  Laboratory Data: Reviewed, see HPI  Pertinent Imaging: I have personally viewed and interpreted the CT with contrast from December 2019 that shows no hydronephrosis, urolithiasis, or bladder lesions.  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  We discussed common possible etiologies of microscopic hematuria including idiopathic, urolithiasis, medical renal disease, and malignancy. We discussed the new asymptomatic microscopic hematuria guidelines and risk categories of low, intermediate, and high risk that are based on age, risk factors like smoking, and degree of microscopic hematuria. We discussed work-up can range from repeat urinalysis, renal ultrasound and cystoscopy, to CT urogram and cystoscopy.  They fall into the low risk category, and I recommended considering renal ultrasound and/or cystoscopy.  We also discussed the reassuring findings of her normal CT from 2019 which was a year after  her first episode of microscopic hematuria.  She would like to defer any further imaging or work-up at this time which I think is reasonable.  Return precautions discussed at length including gross hematuria or new urinary symptoms.   Nickolas Madrid, MD 02/08/2021  [facility] [address] [place], [place] [address] [phone]

## 2021-05-05 ENCOUNTER — Ambulatory Visit
Admission: RE | Admit: 2021-05-05 | Discharge: 2021-05-05 | Disposition: A | Discharge status: Home / Self Care | Payer: BC Managed Care – PPO | Source: Ambulatory Visit | Attending: Internal Medicine | Admitting: Internal Medicine

## 2021-05-05 ENCOUNTER — Other Ambulatory Visit: Payer: Self-pay

## 2021-05-05 DIAGNOSIS — Z1231 Encounter for screening mammogram for malignant neoplasm of breast: Secondary | ICD-10-CM | POA: Insufficient documentation

## 2022-10-02 IMAGING — MG MM DIGITAL SCREENING BILAT W/ TOMO AND CAD
8 series · 8 of 24 positions shown · non-contrast
Comparison: Previous exam(s).

CLINICAL DATA: Screening.

EXAM:
DIGITAL SCREENING BILATERAL MAMMOGRAM WITH TOMOSYNTHESIS AND CAD
TECHNIQUE: Bilateral screening digital craniocaudal and mediolateral oblique
mammograms were obtained. Bilateral screening digital breast
tomosynthesis was performed. The images were evaluated with
computer-aided detection.

[R CC synth-2D]
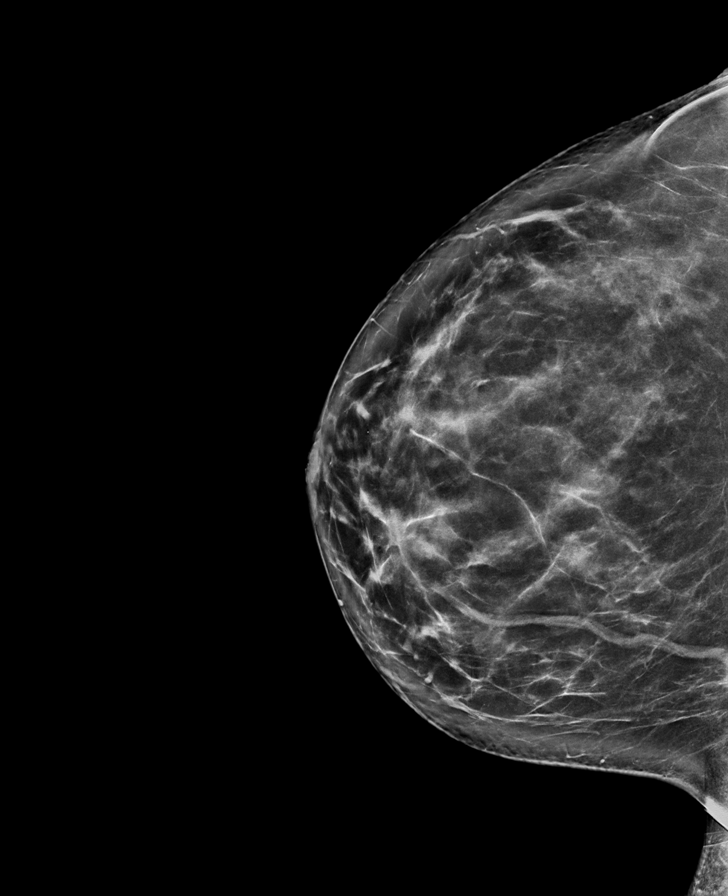

[R MLO synth-2D]
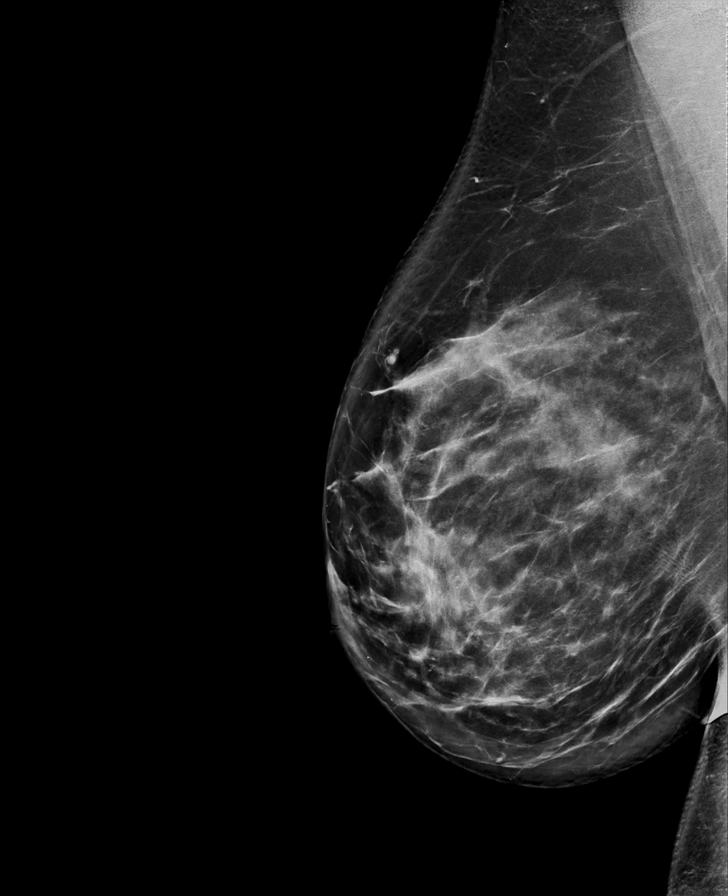

[L MLO synth-2D]
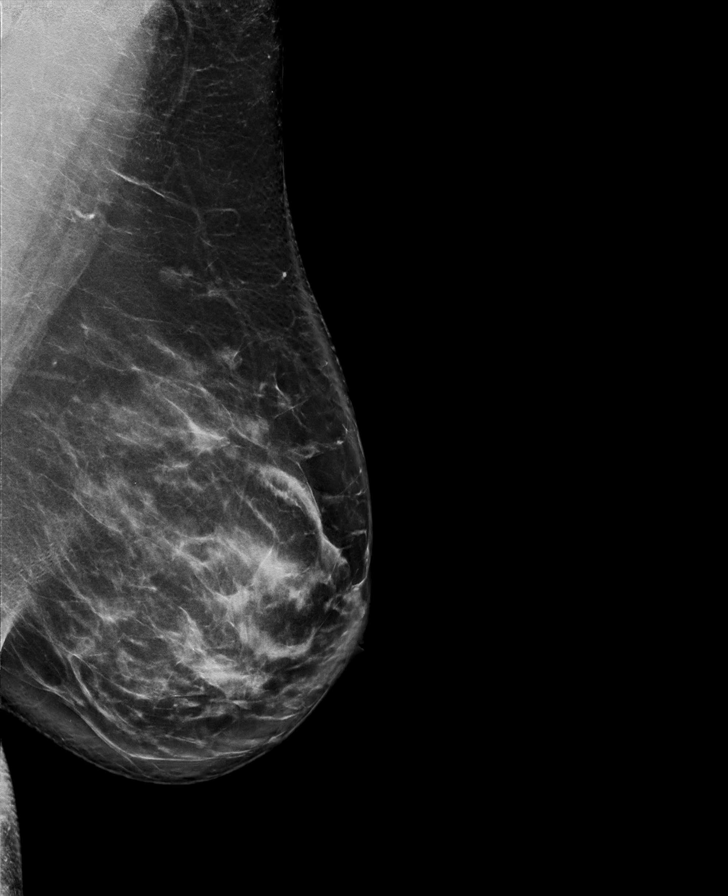

[L CC synth-2D]
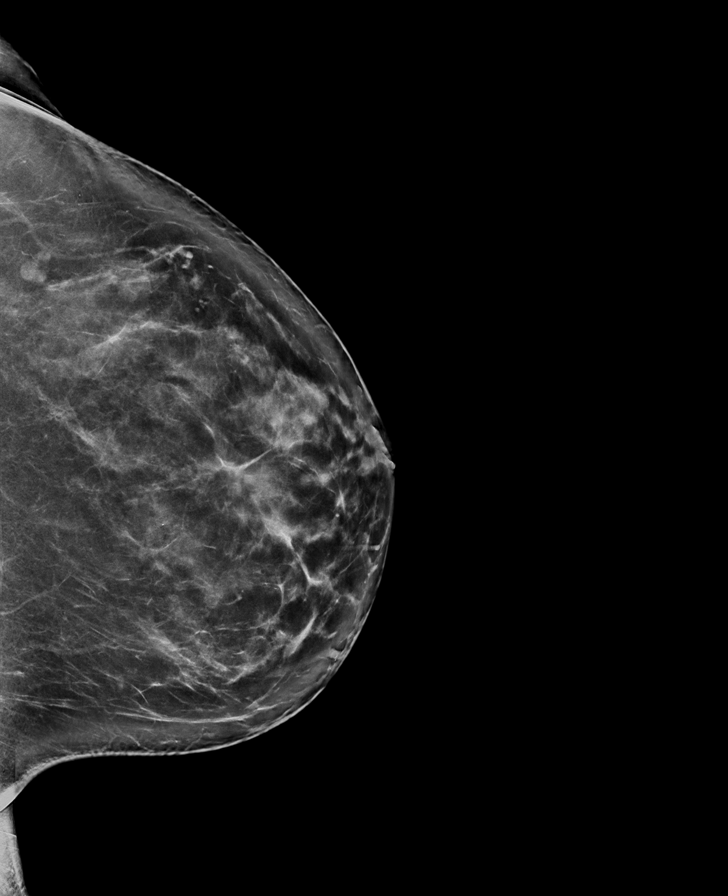

[R MLO tomo · tomo slice 51/101.0]
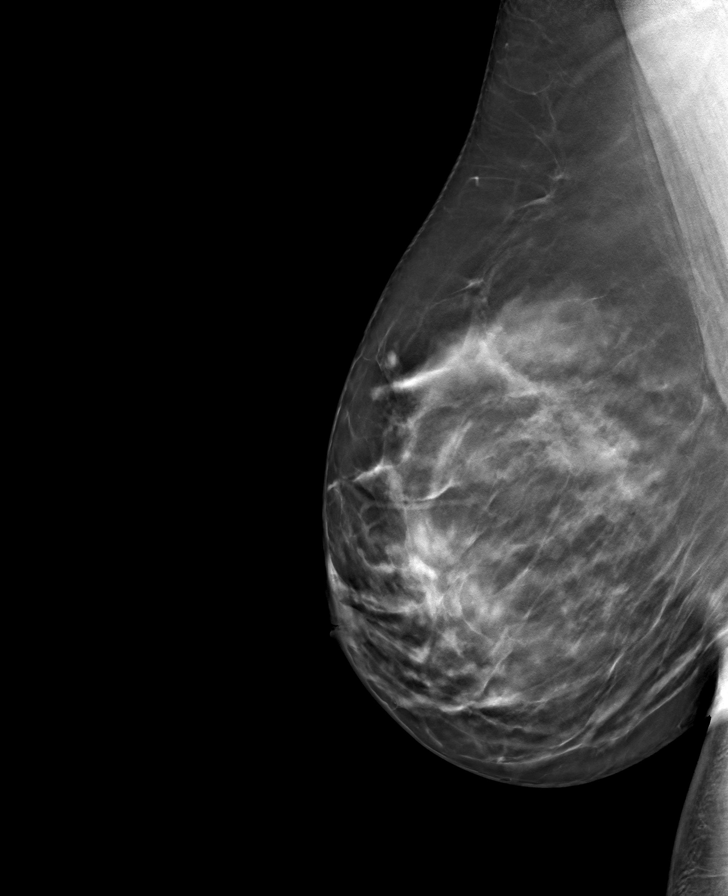

[L CC tomo · tomo slice 48/95.0]
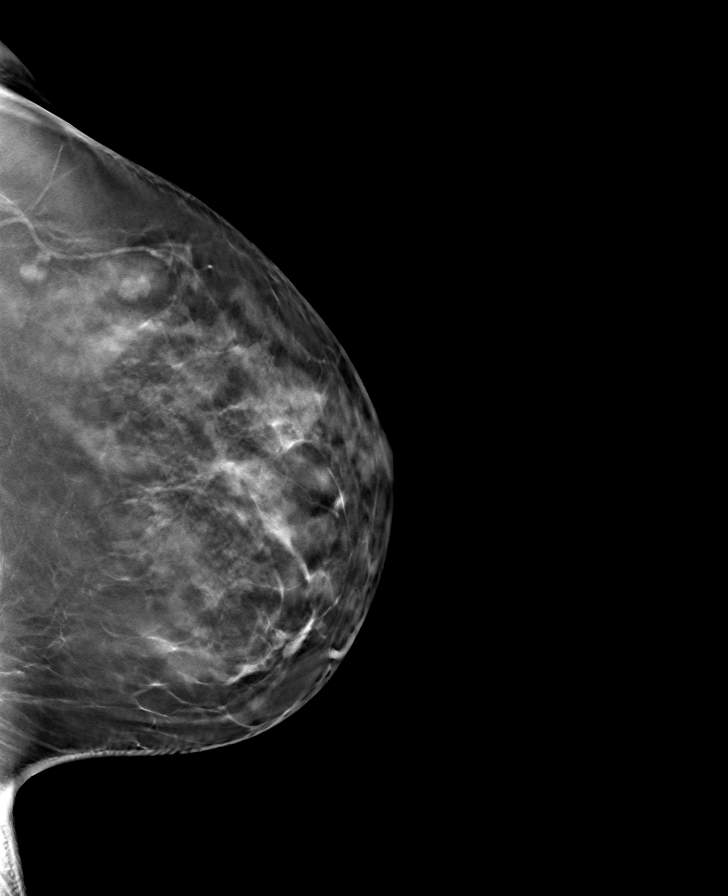

[R CC tomo · tomo slice 49/98.0]
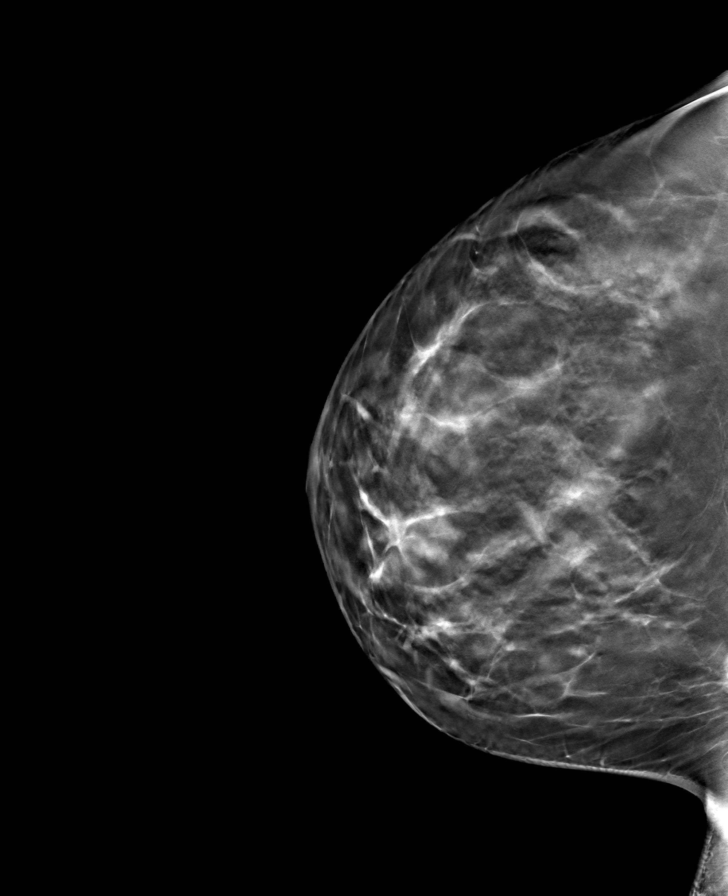

[L MLO tomo · tomo slice 53/106.0]
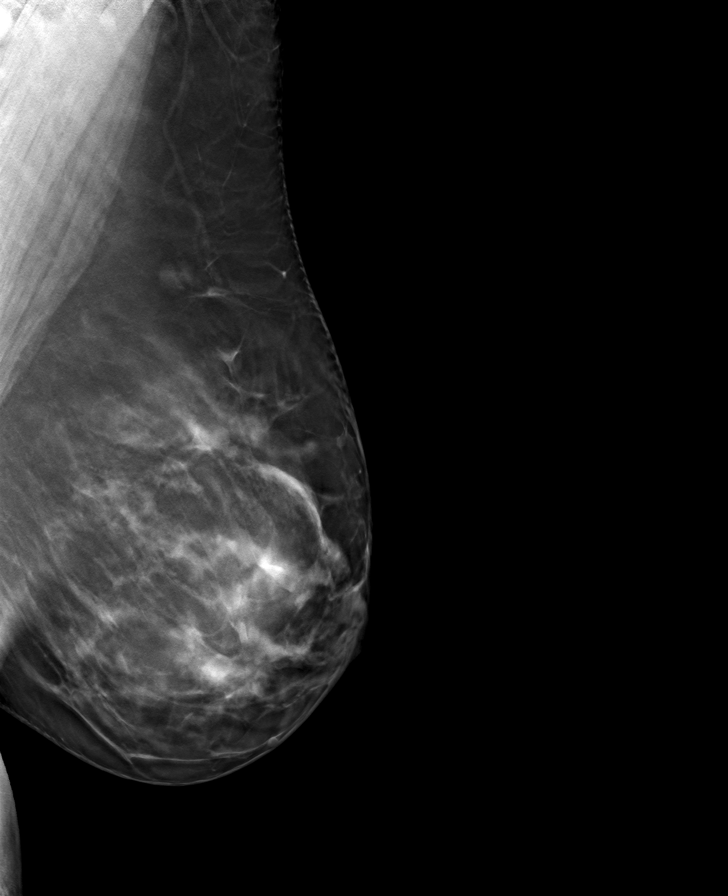

[8 of 24 positions shown; findings below may reference images not displayed]

ACR Breast Density Category c: The breast tissue is heterogeneously
dense, which may obscure small masses.
FINDINGS: There are no findings suspicious for malignancy.
IMPRESSION: No mammographic evidence of malignancy. A result letter of this
screening mammogram will be mailed directly to the patient.

RECOMMENDATION:
Screening mammogram in one year. (Code:Q3-W-BC3)

BI-RADS CATEGORY  1: Negative.

## 2022-12-19 ENCOUNTER — Other Ambulatory Visit: Payer: Self-pay | Admitting: Internal Medicine

## 2022-12-19 DIAGNOSIS — Z1231 Encounter for screening mammogram for malignant neoplasm of breast: Secondary | ICD-10-CM

## 2022-12-27 ENCOUNTER — Ambulatory Visit
Admission: RE | Admit: 2022-12-27 | Discharge: 2022-12-27 | Disposition: A | Discharge status: Home / Self Care | Payer: BC Managed Care – PPO | Source: Ambulatory Visit | Attending: Internal Medicine | Admitting: Internal Medicine

## 2022-12-27 DIAGNOSIS — Z1231 Encounter for screening mammogram for malignant neoplasm of breast: Secondary | ICD-10-CM | POA: Insufficient documentation

## 2024-05-31 ENCOUNTER — Ambulatory Visit

## 2024-05-31 ENCOUNTER — Ambulatory Visit
Admission: RE | Admit: 2024-05-31 | Discharge: 2024-05-31 | Disposition: A | Payer: Self-pay | Attending: Gastroenterology | Admitting: Gastroenterology

## 2024-05-31 ENCOUNTER — Encounter: Payer: Self-pay | Admitting: Gastroenterology

## 2024-05-31 ENCOUNTER — Encounter: Admission: RE | Disposition: A | Payer: Self-pay | Source: Home / Self Care | Attending: Gastroenterology

## 2024-05-31 HISTORY — PX: COLONOSCOPY: SHX5424

## 2024-05-31 HISTORY — PX: POLYPECTOMY: SHX149

## 2024-05-31 SURGERY — COLONOSCOPY
Anesthesia: General

## 2024-05-31 MED ORDER — PROPOFOL 500 MG/50ML IV EMUL
INTRAVENOUS | Status: DC | PRN
  Administered 2024-05-31: 140 ug/kg/min via INTRAVENOUS
  Administered 2024-05-31: 100 mg via INTRAVENOUS
  Administered 2024-05-31: 40 mg via INTRAVENOUS

## 2024-05-31 MED ORDER — SODIUM CHLORIDE 0.9 % IV SOLN
INTRAVENOUS | Status: DC
  Administered 2024-05-31: 20 mL/h via INTRAVENOUS

## 2024-05-31 MED ORDER — LIDOCAINE HCL (CARDIAC) PF 100 MG/5ML IV SOSY
PREFILLED_SYRINGE | INTRAVENOUS | Status: DC | PRN
  Administered 2024-05-31: 20 mg via INTRAVENOUS

## 2024-05-31 NOTE — Anesthesia Postprocedure Evaluation (Signed)
 Anesthesia Post Note  Patient: Emily Jensen  Procedure(s) Performed: COLONOSCOPY POLYPECTOMY, INTESTINE  Patient location during evaluation: Endoscopy Anesthesia Type: General Level of consciousness: awake and alert Pain management: pain level controlled Vital Signs Assessment: post-procedure vital signs reviewed and stable Respiratory status: spontaneous breathing, nonlabored ventilation, respiratory function stable and patient connected to nasal cannula oxygen Cardiovascular status: blood pressure returned to baseline and stable Postop Assessment: no apparent nausea or vomiting Anesthetic complications: no   No notable events documented.   Last Vitals:  Vitals:   05/31/24 0853 05/31/24 0903  BP: 95/62 106/74  Pulse: 87   Resp: 16 17  Temp:    SpO2: 100% 100%    Last Pain:  Vitals:   05/31/24 0903  TempSrc:   PainSc: 0-No pain                 Prentice Murphy

## 2024-05-31 NOTE — Transfer of Care (Signed)
 Immediate Anesthesia Transfer of Care Note  Patient: Emily Jensen  Procedure(s) Performed: COLONOSCOPY POLYPECTOMY, INTESTINE  Patient Location: PACU and Endoscopy Unit  Anesthesia Type:General  Level of Consciousness: awake and oriented  Airway & Oxygen Therapy: Patient Spontanous Breathing  Post-op Assessment: Report given to RN and Post -op Vital signs reviewed and stable  Post vital signs: Reviewed and stable  Last Vitals:  Vitals Value Taken Time  BP    Temp    Pulse 84 05/31/24 08:52  Resp 17 05/31/24 08:52  SpO2 100 % 05/31/24 08:52  Vitals shown include unfiled device data.  Last Pain:  Vitals:   05/31/24 0747  TempSrc: Temporal  PainSc: 0-No pain         Complications: No notable events documented.

## 2024-05-31 NOTE — H&P (Signed)
 [name] , MD [address], [address], [place], [place], [address] Phone: [phone] Fax: [phone]  Primary Care Physician:  Sadie Manna, MD   Pre-Procedure History & Physical: HPI:  Emily Jensen is a 48 y.o. female is here for an colonoscopy.   Past Medical History:  Diagnosis Date   Chicken pox    Genital herpes    Menometrorrhagia     Past Surgical History:  Procedure Laterality Date   BREAST BIOPSY Left 10/11/2012   fibroadenoma removed   BREAST EXCISIONAL BIOPSY Left    2014 fibroadenoma excised   CYSTOSCOPY N/A 05/22/2018   Procedure: CYSTOSCOPY;  Surgeon: Leonce Garnette BIRCH, MD;  Location: [facility];  Service: Gynecology;  Laterality: N/A;   EYE SURGERY  2008   Lasix   KNEE SURGERY Left 1992   arthroscopy for Torn meniscus & ACL   LAPAROSCOPIC HYSTERECTOMY Bilateral 05/22/2018   Procedure: HYSTERECTOMY TOTAL LAPAROSCOPIC BILATERAL SALPINGECTOMY;  Surgeon: Leonce Garnette BIRCH, MD;  Location: [facility];  Service: Gynecology;  Laterality: Bilateral;   MOUTH SURGERY  2000   gum graft   WISDOM TOOTH EXTRACTION  2000    Prior to Admission medications   Medication Sig Start Date End Date Taking? Authorizing Provider  acyclovir  (ZOVIRAX ) 800 MG tablet Take 1 tablet (800 mg total) by mouth daily. 01/29/20   Enola Tobias PARAS, PA  b complex vitamins tablet Take 1 tablet by mouth daily.    [provider]  cholecalciferol (VITAMIN D ) 1000 units tablet Take 1,000 Units by mouth daily.    [provider]  ibuprofen  (ADVIL ,MOTRIN ) 600 MG tablet Take 1 tablet (600 mg total) by mouth every 6 (six) hours as needed for mild pain. 05/22/18   Leonce Garnette BIRCH, MD  Inulin (FIBER CHOICE PO) Take 1 tablet by mouth daily.    [provider]    Allergies as of 05/13/2024 - Review Complete 02/08/2021  Allergen Reaction  Noted   Hydrocodone Hives 04/28/2020   Oxycodone  Hives 04/28/2020   Codeine Palpitations, Other (See Comments), and Hives 10/02/2012    Family History  Problem Relation Age of Onset   Hypertension Mother    Heart disease Father 11       CAD - Quadruple bypass/ MI x 2   Hypertension Father    Glaucoma Father    Cancer Maternal Grandfather 22       Colon Cancer   Diabetes Paternal Grandfather    Breast cancer Neg Hx     Social History   Socioeconomic History   Marital status: Married    Spouse name: Emily Jensen Ferretti   Number of children: 2   Years of education: 18   Highest education level: Not on file  Occupational History   Occupation: Garment/textile Technologist: [employer]     Comment: Mining Engineer  Tobacco Use   Smoking status: Former    Types: Cigarettes   Smokeless tobacco: Never  Vaping Use   Vaping status: Never Used  Substance and Sexual Activity   Alcohol use: Yes    Comment: occasionally - maybe 1 drink/week   Drug use: No   Sexual activity: Yes    Birth control/protection: Other-see comments    Comment: vasectomy  Other Topics Concern   Not on file  Social History Narrative   Emily Jensen [place] up [place] over. Her father moved approximately every 5 years for job opportunities. She attended [employer] for her Bachelor's in Health and PE.  She then attended [employer] on line for her Master's in [employer]. She works as a clinical biochemist for [employer]. She currently lives at home with her husband, Emily Jensen, and two sons (Emily Jensen, Emily Jensen). They have a family dog named R.J. She enjoys the outdoors.      Caffeine - 1 daily   No smoking   Not currently exercising. Plans on starting   Diet is fairly healthy.   Social Drivers of Corporate Investment Banker Strain: Low Risk  (02/15/2024)   Received from [facility]   Overall Financial Resource Strain (CARDIA)    Difficulty of Paying Living Expenses: Not very  hard  Food Insecurity: No Food Insecurity (02/15/2024)   Received from [facility]   Hunger Vital Sign    Within the past 12 months, you worried that your food would run out before you got the money to buy more.: Never true    Within the past 12 months, the food you bought just didn't last and you didn't have money to get more.: Never true  Transportation Needs: No Transportation Needs (02/15/2024)   Received from [facility] - Transportation    In the past 12 months, has lack of transportation kept you from medical appointments or from getting medications?: No    Lack of Transportation (Non-Medical): No  Physical Activity: Not on file  Stress: Not on file  Social Connections: Not on file  Intimate Partner Violence: Not on file    Review of Systems: See HPI, otherwise negative ROS  Physical Exam: LMP 05/07/2018 (Exact Date)  General:   Alert,  pleasant and cooperative in NAD Head:  Normocephalic and atraumatic. Neck:  Supple; no masses or thyromegaly. Lungs:  Clear throughout to auscultation, normal respiratory effort.    Heart:  +S1, +S2, Regular rate and rhythm, No edema. Abdomen:  Soft, nontender and nondistended. Normal bowel sounds, without guarding, and without rebound.   Neurologic:  Alert and  oriented x4;  grossly normal neurologically.  Impression/Plan: Bhakti R Jagger is here for an colonoscopy to be performed for Screening colonoscopy average risk   Risks, benefits, limitations, and alternatives regarding  colonoscopy have been reviewed with the patient.  Questions have been answered.  All parties agreeable.   [name], MD  05/31/2024, 7:45 AM

## 2024-05-31 NOTE — Anesthesia Preprocedure Evaluation (Signed)
 Anesthesia Evaluation  Patient identified by MRN, date of birth, ID band Patient awake    Reviewed: Allergy & Precautions, H&P , NPO status , Patient's Chart, lab work & pertinent test results, reviewed documented beta blocker date and time   History of Anesthesia Complications Negative for: history of anesthetic complications  Airway Mallampati: III  TM Distance: >3 FB Neck ROM: full    Dental  (+) Dental Advidsory Given, Teeth Intact   Pulmonary neg pulmonary ROS, former smoker   Pulmonary exam normal breath sounds clear to auscultation       Cardiovascular Exercise Tolerance: Good negative cardio ROS Normal cardiovascular exam Rhythm:regular Rate:Normal     Neuro/Psych negative neurological ROS  negative psych ROS   GI/Hepatic negative GI ROS, Neg liver ROS,,,  Endo/Other  negative endocrine ROS    Renal/GU negative Renal ROS  negative genitourinary   Musculoskeletal   Abdominal   Peds  Hematology negative hematology ROS (+)   Anesthesia Other Findings Past Medical History: No date: Chicken pox No date: Genital herpes No date: Menometrorrhagia   Reproductive/Obstetrics negative OB ROS                              Anesthesia Physical Anesthesia Plan  ASA: 1  Anesthesia Plan: General   Post-op Pain Management:    Induction: Intravenous  PONV Risk Score and Plan: 3 and Propofol  infusion, TIVA and Treatment may vary due to age or medical condition  Airway Management Planned: Natural Airway and Nasal Cannula  Additional Equipment:   Intra-op Plan:   Post-operative Plan:   Informed Consent: I have reviewed the patients History and Physical, chart, labs and discussed the procedure including the risks, benefits and alternatives for the proposed anesthesia with the patient or authorized representative who has indicated his/her understanding and acceptance.     Dental  Advisory Given  Plan Discussed with: Anesthesiologist, CRNA and Surgeon  Anesthesia Plan Comments:         Anesthesia Quick Evaluation

## 2024-05-31 NOTE — Op Note (Signed)
 [facility] Patient Name: Emily Jensen Procedure Date: 05/31/2024 8:15 AM MRN: [id number] Account #: [id number] Date of Birth: Oct 27, 1975 Admit Type: Outpatient Age: 48 Room: [facility] ENDO ROOM 4 Gender: Female Note Status: Finalized Instrument Name: Colon Scope [id number] Procedure:             Colonoscopy Indications:           Screening for colorectal malignant neoplasm Providers:             [name] MD, MD Referring MD:          Tamra Leventhal, MD (Referring MD) Medicines:             Monitored Anesthesia Care Complications:         No immediate complications. Procedure:             Pre-Anesthesia Assessment:                        - Prior to the procedure, a History and Physical was                         performed, and patient medications, allergies and                         sensitivities were reviewed. The patient's tolerance                         of previous anesthesia was reviewed.                        - The risks and benefits of the procedure and the                         sedation options and risks were discussed with the                         patient. All questions were answered and informed                         consent was obtained.                        - ASA Grade Assessment: II - A patient with mild                         systemic disease.                        After obtaining informed consent, the colonoscope was                         passed under direct vision. Throughout the procedure,                         the patient's blood pressure, pulse, and oxygen                         saturations were monitored continuously. The                         Colonoscope was  introduced through the anus and                         advanced to the the cecum, identified by the                         appendiceal orifice. The colonoscopy was performed                         with ease. The patient tolerated the procedure well.                          The quality of the bowel preparation was excellent.                         The ileocecal valve, appendiceal orifice, and rectum                         were photographed. Findings:      The perianal and digital rectal examinations were normal.      A 6 mm polyp was found in the ascending colon. The polyp was sessile.       The polyp was removed with a cold snare. Resection and retrieval were       complete.      The exam was otherwise without abnormality on direct and retroflexion       views. Impression:            - One 6 mm polyp in the ascending colon, removed with                         a cold snare. Resected and retrieved.                        - The examination was otherwise normal on direct and                         retroflexion views. Recommendation:        - Discharge patient to home (with escort).                        - Resume previous diet.                        - Await pathology results.                        - Repeat colonoscopy for surveillance based on                         pathology results. Procedure Code(s):     --- Professional ---                        [phone], Colonoscopy, flexible; with removal of                         tumor(s), polyp(s), or other lesion(s) by snare                         technique Diagnosis  Code(s):     --- Professional ---                        Z12.11, Encounter for screening for malignant neoplasm                         of colon                        D12.2, Benign neoplasm of ascending colon CPT copyright 2022 American Medical Association. All rights reserved. The codes documented in this report are preliminary and upon coder review may  be revised to meet current compliance requirements. [name], MD [name] MD, MD 05/31/2024 8:49:39 AM This report has been signed electronically. Number of Addenda: 0 Note Initiated On: 05/31/2024 8:15 AM Scope Withdrawal Time: 0 hours 8 minutes 44 seconds   Total Procedure Duration: 0 hours 11 minutes 10 seconds  Estimated Blood Loss:  Estimated blood loss: none.      [facility]

## 2024-06-03 LAB — SURGICAL PATHOLOGY
